# Patient Record
Sex: Male | Born: 1945 | Race: White | Hispanic: No | Smoking: Current every day smoker
Health system: Southern US, Community
[De-identification: ages and names within clinical notes are randomized; demographics above are authoritative.]

## PROBLEM LIST (undated history)

## (undated) DIAGNOSIS — I639 Cerebral infarction, unspecified: Secondary | ICD-10-CM

## (undated) DIAGNOSIS — E119 Type 2 diabetes mellitus without complications: Secondary | ICD-10-CM

## (undated) DIAGNOSIS — E785 Hyperlipidemia, unspecified: Secondary | ICD-10-CM

## (undated) DIAGNOSIS — I480 Paroxysmal atrial fibrillation: Secondary | ICD-10-CM

## (undated) DIAGNOSIS — I10 Essential (primary) hypertension: Secondary | ICD-10-CM

## (undated) HISTORY — PX: BACK SURGERY: SHX140

---

## 2020-03-10 ENCOUNTER — Encounter (HOSPITAL_COMMUNITY): Payer: Self-pay | Admitting: Internal Medicine

## 2020-03-10 ENCOUNTER — Other Ambulatory Visit: Payer: Self-pay

## 2020-03-10 ENCOUNTER — Inpatient Hospital Stay (HOSPITAL_COMMUNITY): Payer: 59

## 2020-03-10 ENCOUNTER — Encounter (HOSPITAL_COMMUNITY)
Admission: EM | Disposition: A | Discharge status: Skilled Nursing Facility | Payer: Self-pay | Source: Other Acute Inpatient Hospital | Attending: Internal Medicine

## 2020-03-10 ENCOUNTER — Inpatient Hospital Stay (HOSPITAL_COMMUNITY)
Admission: EM | Admit: 2020-03-10 | Discharge: 2020-03-15 | DRG: 378 | Disposition: A | Discharge status: Skilled Nursing Facility | Payer: 59 | Source: Other Acute Inpatient Hospital | Attending: Internal Medicine | Admitting: Internal Medicine

## 2020-03-10 DIAGNOSIS — K5790 Diverticulosis of intestine, part unspecified, without perforation or abscess without bleeding: Secondary | ICD-10-CM | POA: Diagnosis present

## 2020-03-10 DIAGNOSIS — E119 Type 2 diabetes mellitus without complications: Secondary | ICD-10-CM | POA: Diagnosis present

## 2020-03-10 DIAGNOSIS — R05 Cough: Secondary | ICD-10-CM | POA: Diagnosis present

## 2020-03-10 DIAGNOSIS — R12 Heartburn: Secondary | ICD-10-CM | POA: Diagnosis present

## 2020-03-10 DIAGNOSIS — R296 Repeated falls: Secondary | ICD-10-CM | POA: Diagnosis present

## 2020-03-10 DIAGNOSIS — I119 Hypertensive heart disease without heart failure: Secondary | ICD-10-CM | POA: Diagnosis present

## 2020-03-10 DIAGNOSIS — M1990 Unspecified osteoarthritis, unspecified site: Secondary | ICD-10-CM | POA: Diagnosis present

## 2020-03-10 DIAGNOSIS — Z20822 Contact with and (suspected) exposure to covid-19: Secondary | ICD-10-CM | POA: Diagnosis present

## 2020-03-10 DIAGNOSIS — R630 Anorexia: Secondary | ICD-10-CM | POA: Diagnosis present

## 2020-03-10 DIAGNOSIS — Z7901 Long term (current) use of anticoagulants: Secondary | ICD-10-CM

## 2020-03-10 DIAGNOSIS — W19XXXA Unspecified fall, initial encounter: Secondary | ICD-10-CM | POA: Diagnosis present

## 2020-03-10 DIAGNOSIS — Z8249 Family history of ischemic heart disease and other diseases of the circulatory system: Secondary | ICD-10-CM

## 2020-03-10 DIAGNOSIS — I69328 Other speech and language deficits following cerebral infarction: Secondary | ICD-10-CM

## 2020-03-10 DIAGNOSIS — F17210 Nicotine dependence, cigarettes, uncomplicated: Secondary | ICD-10-CM | POA: Diagnosis present

## 2020-03-10 DIAGNOSIS — M170 Bilateral primary osteoarthritis of knee: Secondary | ICD-10-CM | POA: Diagnosis present

## 2020-03-10 DIAGNOSIS — E785 Hyperlipidemia, unspecified: Secondary | ICD-10-CM | POA: Diagnosis present

## 2020-03-10 DIAGNOSIS — K2971 Gastritis, unspecified, with bleeding: Secondary | ICD-10-CM | POA: Diagnosis present

## 2020-03-10 DIAGNOSIS — R531 Weakness: Secondary | ICD-10-CM | POA: Diagnosis present

## 2020-03-10 DIAGNOSIS — J449 Chronic obstructive pulmonary disease, unspecified: Secondary | ICD-10-CM | POA: Diagnosis present

## 2020-03-10 DIAGNOSIS — R0602 Shortness of breath: Secondary | ICD-10-CM | POA: Diagnosis not present

## 2020-03-10 DIAGNOSIS — I10 Essential (primary) hypertension: Secondary | ICD-10-CM | POA: Diagnosis not present

## 2020-03-10 DIAGNOSIS — M25561 Pain in right knee: Secondary | ICD-10-CM

## 2020-03-10 DIAGNOSIS — R778 Other specified abnormalities of plasma proteins: Secondary | ICD-10-CM | POA: Diagnosis present

## 2020-03-10 DIAGNOSIS — K922 Gastrointestinal hemorrhage, unspecified: Secondary | ICD-10-CM | POA: Diagnosis present

## 2020-03-10 DIAGNOSIS — R079 Chest pain, unspecified: Secondary | ICD-10-CM

## 2020-03-10 DIAGNOSIS — Z7984 Long term (current) use of oral hypoglycemic drugs: Secondary | ICD-10-CM

## 2020-03-10 DIAGNOSIS — K921 Melena: Secondary | ICD-10-CM | POA: Diagnosis present

## 2020-03-10 DIAGNOSIS — R0902 Hypoxemia: Secondary | ICD-10-CM | POA: Diagnosis present

## 2020-03-10 DIAGNOSIS — R4781 Slurred speech: Secondary | ICD-10-CM | POA: Diagnosis present

## 2020-03-10 DIAGNOSIS — M25562 Pain in left knee: Secondary | ICD-10-CM | POA: Diagnosis present

## 2020-03-10 DIAGNOSIS — Z79899 Other long term (current) drug therapy: Secondary | ICD-10-CM

## 2020-03-10 DIAGNOSIS — R000 Tachycardia, unspecified: Secondary | ICD-10-CM | POA: Diagnosis present

## 2020-03-10 DIAGNOSIS — Z720 Tobacco use: Secondary | ICD-10-CM | POA: Diagnosis present

## 2020-03-10 DIAGNOSIS — R6510 Systemic inflammatory response syndrome (SIRS) of non-infectious origin without acute organ dysfunction: Secondary | ICD-10-CM | POA: Diagnosis present

## 2020-03-10 DIAGNOSIS — D62 Acute posthemorrhagic anemia: Secondary | ICD-10-CM | POA: Diagnosis present

## 2020-03-10 DIAGNOSIS — K31811 Angiodysplasia of stomach and duodenum with bleeding: Secondary | ICD-10-CM | POA: Diagnosis present

## 2020-03-10 DIAGNOSIS — I69351 Hemiplegia and hemiparesis following cerebral infarction affecting right dominant side: Secondary | ICD-10-CM | POA: Diagnosis not present

## 2020-03-10 DIAGNOSIS — I480 Paroxysmal atrial fibrillation: Secondary | ICD-10-CM | POA: Diagnosis present

## 2020-03-10 DIAGNOSIS — D175 Benign lipomatous neoplasm of intra-abdominal organs: Secondary | ICD-10-CM | POA: Diagnosis present

## 2020-03-10 DIAGNOSIS — Z8673 Personal history of transient ischemic attack (TIA), and cerebral infarction without residual deficits: Secondary | ICD-10-CM

## 2020-03-10 DIAGNOSIS — R509 Fever, unspecified: Secondary | ICD-10-CM | POA: Diagnosis present

## 2020-03-10 HISTORY — DX: Hyperlipidemia, unspecified: E78.5

## 2020-03-10 HISTORY — DX: Essential (primary) hypertension: I10

## 2020-03-10 HISTORY — PX: GIVENS CAPSULE STUDY: SHX5432

## 2020-03-10 HISTORY — DX: Type 2 diabetes mellitus without complications: E11.9

## 2020-03-10 HISTORY — DX: Cerebral infarction, unspecified: I63.9

## 2020-03-10 HISTORY — DX: Paroxysmal atrial fibrillation: I48.0

## 2020-03-10 LAB — GLUCOSE, CAPILLARY
Glucose-Capillary: 130 mg/dL — ABNORMAL HIGH (ref 70–99)
Glucose-Capillary: 114 mg/dL — ABNORMAL HIGH (ref 70–99)
Glucose-Capillary: 124 mg/dL — ABNORMAL HIGH (ref 70–99)

## 2020-03-10 LAB — CBC WITH DIFFERENTIAL/PLATELET
Abs Immature Granulocytes: 0.04 10*3/uL (ref 0.00–0.07)
Basophils Absolute: 0 10*3/uL (ref 0.0–0.1)
Basophils Relative: 0 %
Eosinophils Absolute: 0.2 10*3/uL (ref 0.0–0.5)
Eosinophils Relative: 3 %
HCT: 31 % — ABNORMAL LOW (ref 39.0–52.0)
Hemoglobin: 10.2 g/dL — ABNORMAL LOW (ref 13.0–17.0)
Immature Granulocytes: 1 %
Lymphocytes Relative: 16 %
Lymphs Abs: 1.2 10*3/uL (ref 0.7–4.0)
MCH: 29.4 pg (ref 26.0–34.0)
MCHC: 32.9 g/dL (ref 30.0–36.0)
MCV: 89.3 fL (ref 80.0–100.0)
Monocytes Absolute: 0.9 10*3/uL (ref 0.1–1.0)
Monocytes Relative: 11 %
Neutro Abs: 5.3 10*3/uL (ref 1.7–7.7)
Neutrophils Relative %: 69 %
Platelets: 211 10*3/uL (ref 150–400)
RBC: 3.47 MIL/uL — ABNORMAL LOW (ref 4.22–5.81)
RDW: 16.4 % — ABNORMAL HIGH (ref 11.5–15.5)
WBC: 7.7 10*3/uL (ref 4.0–10.5)
nRBC: 0 % (ref 0.0–0.2)

## 2020-03-10 LAB — ECHOCARDIOGRAM COMPLETE
Height: 71 in
Weight: 3200 oz

## 2020-03-10 LAB — MRSA PCR SCREENING: MRSA by PCR: NEGATIVE

## 2020-03-10 LAB — COMPREHENSIVE METABOLIC PANEL
ALT: 21 U/L (ref 0–44)
AST: 46 U/L — ABNORMAL HIGH (ref 15–41)
Albumin: 2.7 g/dL — ABNORMAL LOW (ref 3.5–5.0)
Alkaline Phosphatase: 55 U/L (ref 38–126)
Anion gap: 8 (ref 5–15)
BUN: 10 mg/dL (ref 8–23)
CO2: 21 mmol/L — ABNORMAL LOW (ref 22–32)
Calcium: 7.9 mg/dL — ABNORMAL LOW (ref 8.9–10.3)
Chloride: 111 mmol/L (ref 98–111)
Creatinine, Ser: 1.02 mg/dL (ref 0.61–1.24)
GFR calc Af Amer: >60 mL/min (ref >60)
GFR calc non Af Amer: >60 mL/min (ref >60)
Glucose, Bld: 142 mg/dL — ABNORMAL HIGH (ref 70–99)
Potassium: 3.6 mmol/L (ref 3.5–5.1)
Sodium: 140 mmol/L (ref 135–145)
Total Bilirubin: 1.5 mg/dL — ABNORMAL HIGH (ref 0.3–1.2)
Total Protein: 5.4 g/dL — ABNORMAL LOW (ref 6.5–8.1)

## 2020-03-10 LAB — MAGNESIUM: Magnesium: 1.5 mg/dL — ABNORMAL LOW (ref 1.7–2.4)

## 2020-03-10 LAB — HEMOGLOBIN AND HEMATOCRIT, BLOOD
HCT: 28.8 % — ABNORMAL LOW (ref 39.0–52.0)
Hemoglobin: 9.4 g/dL — ABNORMAL LOW (ref 13.0–17.0)

## 2020-03-10 LAB — ABO/RH: ABO/RH(D): B POS

## 2020-03-10 LAB — TYPE AND SCREEN
ABO/RH(D): B POS
Antibody Screen: NEGATIVE

## 2020-03-10 LAB — TROPONIN I (HIGH SENSITIVITY)
Troponin I (High Sensitivity): 4585 ng/L (ref <18)
Troponin I (High Sensitivity): 4405 ng/L (ref <18)

## 2020-03-10 SURGERY — IMAGING PROCEDURE, GI TRACT, INTRALUMINAL, VIA CAPSULE
Anesthesia: LOCAL

## 2020-03-10 MED ORDER — PANTOPRAZOLE SODIUM 40 MG IV SOLR: 40.0000 mg | Freq: Two times a day (BID) | INTRAVENOUS | Status: DC

## 2020-03-10 MED ORDER — SODIUM CHLORIDE 0.9 % IV SOLN
8.0000 mg/h | INTRAVENOUS | Status: DC
  Administered 2020-03-10 – 2020-03-11 (×4): 8 mg/h via INTRAVENOUS
  Filled 2020-03-10 (×5): qty 80

## 2020-03-10 MED ORDER — SODIUM CHLORIDE 0.9% FLUSH
3.0000 mL | Freq: Two times a day (BID) | INTRAVENOUS | Status: DC
  Administered 2020-03-10 – 2020-03-15 (×10): 3 mL via INTRAVENOUS

## 2020-03-10 MED ORDER — INSULIN ASPART 100 UNIT/ML ~~LOC~~ SOLN
0.0000 [IU] | Freq: Three times a day (TID) | SUBCUTANEOUS | Status: DC
  Administered 2020-03-11 – 2020-03-15 (×7): 1 [IU] via SUBCUTANEOUS

## 2020-03-10 MED ORDER — ACETAMINOPHEN 325 MG PO TABS
650.0000 mg | ORAL_TABLET | Freq: Four times a day (QID) | ORAL | Status: DC | PRN
  Administered 2020-03-11 – 2020-03-15 (×2): 650 mg via ORAL
  Filled 2020-03-10 (×2): qty 2

## 2020-03-10 MED ORDER — AMIODARONE HCL IN DEXTROSE 360-4.14 MG/200ML-% IV SOLN
60.0000 mg/h | INTRAVENOUS | Status: AC
  Administered 2020-03-10 (×2): 60 mg/h via INTRAVENOUS
  Filled 2020-03-10: qty 200

## 2020-03-10 MED ORDER — CHLORHEXIDINE GLUCONATE CLOTH 2 % EX PADS
6.0000 | MEDICATED_PAD | Freq: Every day | CUTANEOUS | Status: DC
  Administered 2020-03-11 – 2020-03-14 (×4): 6 via TOPICAL

## 2020-03-10 MED ORDER — INSULIN ASPART 100 UNIT/ML ~~LOC~~ SOLN
0.0000 [IU] | Freq: Every day | SUBCUTANEOUS | Status: DC
  Administered 2020-03-12: 2 [IU] via SUBCUTANEOUS

## 2020-03-10 MED ORDER — ALBUTEROL SULFATE (2.5 MG/3ML) 0.083% IN NEBU: 2.5000 mg | INHALATION_SOLUTION | Freq: Four times a day (QID) | RESPIRATORY_TRACT | Status: DC | PRN

## 2020-03-10 MED ORDER — SODIUM CHLORIDE 0.9 % IV SOLN
2.0000 g | INTRAVENOUS | Status: DC
  Administered 2020-03-10: 2 g via INTRAVENOUS
  Filled 2020-03-10: qty 20

## 2020-03-10 MED ORDER — SODIUM CHLORIDE 0.9 % IV SOLN
80.0000 mg | Freq: Once | INTRAVENOUS | Status: AC
  Administered 2020-03-10: 80 mg via INTRAVENOUS
  Filled 2020-03-10: qty 80

## 2020-03-10 MED ORDER — AMIODARONE LOAD VIA INFUSION
150.0000 mg | Freq: Once | INTRAVENOUS | Status: AC
  Administered 2020-03-10: 150 mg via INTRAVENOUS
  Filled 2020-03-10: qty 83.34

## 2020-03-10 MED ORDER — DILTIAZEM HCL-DEXTROSE 125-5 MG/125ML-% IV SOLN (PREMIX)
5.0000 mg/h | INTRAVENOUS | Status: DC
  Filled 2020-03-10: qty 125

## 2020-03-10 MED ORDER — METRONIDAZOLE IN NACL 5-0.79 MG/ML-% IV SOLN
500.0000 mg | Freq: Three times a day (TID) | INTRAVENOUS | Status: DC
  Administered 2020-03-10 – 2020-03-11 (×3): 500 mg via INTRAVENOUS
  Filled 2020-03-10 (×3): qty 100

## 2020-03-10 MED ORDER — AMIODARONE HCL IN DEXTROSE 360-4.14 MG/200ML-% IV SOLN
30.0000 mg/h | INTRAVENOUS | Status: DC
  Administered 2020-03-10 – 2020-03-11 (×2): 30 mg/h via INTRAVENOUS
  Filled 2020-03-10 (×3): qty 200

## 2020-03-10 MED ORDER — MAGNESIUM SULFATE 2 GM/50ML IV SOLN
2.0000 g | Freq: Once | INTRAVENOUS | Status: AC
  Administered 2020-03-10: 2 g via INTRAVENOUS
  Filled 2020-03-10: qty 50

## 2020-03-10 MED ORDER — MORPHINE SULFATE (PF) 2 MG/ML IV SOLN
2.0000 mg | INTRAVENOUS | Status: DC | PRN
  Administered 2020-03-10 – 2020-03-13 (×9): 2 mg via INTRAVENOUS
  Filled 2020-03-10 (×9): qty 1

## 2020-03-10 MED ORDER — PERFLUTREN LIPID MICROSPHERE
1.0000 mL | INTRAVENOUS | Status: AC | PRN
  Administered 2020-03-10: 3 mL via INTRAVENOUS
  Filled 2020-03-10: qty 10

## 2020-03-10 MED ORDER — ACETAMINOPHEN 650 MG RE SUPP: 650.0000 mg | Freq: Four times a day (QID) | RECTAL | Status: DC | PRN

## 2020-03-10 SURGICAL SUPPLY — 1 items: TOWEL COTTON PACK 4EA (MISCELLANEOUS) ×4 IMPLANT

## 2020-03-10 NOTE — Consult Note (Addendum)
Referring Provider: [facility] Primary Care Physician:  Patient, No Pcp Per Primary Gastroenterologist:  Unassigned  Reason for Consultation:  Melena, anemia  HPI: Corey Esparza is a 74 y.o. male with history of A fib (on amiodarone, denies recent Eliquis use but was previously on Eliquis), NSTEMI, recent CVA and COPD, presenting from an outside hospital for melena and anemia.  He underwent EGD and colonoscopy which were both unrevealing for any sources of bleeding.  Patient initially presented to outside ED with chest pain and shortness of breath and was found to have a Hgb of 8.3 with heme-positive stools.  EGD by general surgery revealed gastritis with some blood in the stomach but no active bleeding.  Colonoscopy was pertinent for diverticulosis and a lipoma but no signs of bleeding.  Patient states that he has had melena for approximately 3 months.  He typically has one soft, melenic stool daily to every other day.  He denies any hematochezia.  He denies any abdominal pain, chest pain, shortness of breath, nausea, vomiting, hematemesis, dysphagia, changes in appetite, unexplained weight loss.  He does state that he has had heartburn but it has improved since he has been in the hospital and being treated with PPI therapy.  Patient states he takes BC powder at least twice per week.  Denies blood thinner use.  Drinks approximately 6 beers per day.  Denies prior episodes of GI bleeding.  Denies any family history of colon cancer or gastrointestinal malignancies.  No past medical history on file.   Prior to Admission medications   Not on File    Scheduled Meds: . [START ON 03/13/2020] pantoprazole  40 mg Intravenous Q12H  . sodium chloride flush  3 mL Intravenous Q12H   Continuous Infusions: . amiodarone 60 mg/hr (03/10/20 0728)   Followed by  . amiodarone    . pantoprozole (PROTONIX) infusion     PRN Meds:.acetaminophen **OR** acetaminophen, albuterol  Allergies as of  03/09/2020  . (Not on File)    No family history on file.  Social History   Socioeconomic History  . Marital status: Single    Spouse name: Not on file  . Number of children: Not on file  . Years of education: Not on file  . Highest education level: Not on file  Occupational History  . Not on file  Tobacco Use  . Smoking status: Not on file  Substance and Sexual Activity  . Alcohol use: Not on file  . Drug use: Not on file  . Sexual activity: Not on file  Other Topics Concern  . Not on file  Social History Narrative  . Not on file   Social Determinants of Health   Financial Resource Strain:   . Difficulty of Paying Living Expenses:   Food Insecurity:   . Worried About Charity fundraiser in the Last Year:   . Arboriculturist in the Last Year:   Transportation Needs:   . Film/video editor (Medical):   Marland Kitchen Lack of Transportation (Non-Medical):   Physical Activity:   . Days of Exercise per Week:   . Minutes of Exercise per Session:   Stress:   . Feeling of Stress :   Social Connections:   . Frequency of Communication with Friends and Family:   . Frequency of Social Gatherings with Friends and Family:   . Attends Religious Services:   . Active Member of Clubs or Organizations:   . Attends Archivist Meetings:   .  Marital Status:   Intimate Partner Violence:   . Fear of Current or Ex-Partner:   . Emotionally Abused:   Marland Kitchen Physically Abused:   . Sexually Abused:     Review of Systems: Review of Systems  Constitutional: Negative for chills, fever and weight loss.  HENT: Negative for hearing loss and tinnitus.   Eyes: Negative for pain and redness.  Respiratory: Negative for cough and shortness of breath.   Cardiovascular: Negative for chest pain and palpitations.  Gastrointestinal: Positive for heartburn and melena. Negative for abdominal pain, blood in stool, constipation, diarrhea, nausea and vomiting.  Genitourinary: Negative for flank pain and  hematuria.  Musculoskeletal: Negative for back pain and joint pain.  Skin: Negative for itching and rash.  Neurological: Negative for seizures and loss of consciousness.  Endo/Heme/Allergies: Negative for polydipsia. Does not bruise/bleed easily.  Psychiatric/Behavioral: Positive for substance abuse (ETOH). The patient is not nervous/anxious.      Physical Exam: Vital signs: Vitals:   03/10/20 0600 03/10/20 0748  BP: 113/75   Resp: (!) 23   Temp:  (!) 97.5 F (36.4 C)     Physical Exam Constitutional:      General: He is not in acute distress.    Appearance: Normal appearance.  HENT:     Head: Normocephalic and atraumatic.     Mouth/Throat:     Mouth: Mucous membranes are dry.     Pharynx: Oropharynx is clear.  Eyes:     General: No scleral icterus.    Extraocular Movements: Extraocular movements intact.     Comments: Conjunctival pallor  Cardiovascular:     Rate and Rhythm: Normal rate and regular rhythm.     Pulses: Normal pulses.     Heart sounds: Normal heart sounds.  Pulmonary:     Effort: Pulmonary effort is normal. No respiratory distress.     Breath sounds: Wheezing (expiratory, bilateral) present.  Abdominal:     General: Bowel sounds are normal. There is no distension.     Palpations: Abdomen is soft. There is no mass.     Tenderness: There is no abdominal tenderness. There is no guarding.  Musculoskeletal:        General: No swelling or deformity.     Cervical back: Normal range of motion and neck supple.     Right lower leg: No edema.     Left lower leg: No edema.  Skin:    General: Skin is warm and dry.  Neurological:     General: No focal deficit present.     Mental Status: He is alert and oriented to person, place, and time.  Psychiatric:        Mood and Affect: Mood normal.        Behavior: Behavior normal.      GI:  Lab Results: Recent Labs    03/10/20 0619  WBC 7.7  HGB 10.2*  HCT 31.0*  PLT 211   BMET Recent Labs     03/10/20 0619  NA 140  K 3.6  CL 111  CO2 21*  GLUCOSE 142*  BUN 10  CREATININE 1.02  CALCIUM 7.9*   LFT Recent Labs    03/10/20 0619  PROT 5.4*  ALBUMIN 2.7*  AST 46*  ALT 21  ALKPHOS 55  BILITOT 1.5*   PT/INR No results for input(s): LABPROT, INR in the last 72 hours.   Studies/Results: No results found.  Impression: Melena and anemia of unknown origin.  Negative EGD and colonoscopy at outside  hospital. -Hgb 10.2, no baseline on file for comparison -Renal function within normal limits (BUN 10/Cr 1.02) -Regular use of BC powder -Prior Eliquis use but no recent Eliquis use per patient -s/p ?2u pRBCs at outside hospital  NSTEMI: at time of presentation to outside facility -Troponin increased from 0.58 to 4.98  Febrile to 100.8 at outside hospital with negative chest x-ray. Currently afebrile  A fib: currently on Amiodarone  COPD  Plan: We will proceed with capsule endoscopy today.  I thoroughly discussed nature, benefits, and risks (including but not limited to capsule retention) with the patient.  Patient verbalized understanding and is in agreement to proceed with capsule endoscopy today.  Continue Protonix IV twice daily.  Continue to monitor H&H with transfusion as needed to maintain hemoglobin greater than 7.  [facility] will follow.   LOS: 0 days   Salley Slaughter  PA-C 03/10/2020, 8:50 AM  Contact #  [phone]

## 2020-03-10 NOTE — H&P (Addendum)
History and Physical    Corey Esparza SWF:[id number] DOB: 16-Jan-1946 DOA: 03/10/2020  Referring MD/NP/PA: Jennette Kettle, MD PCP: Patient, No Pcp Per  Patient coming from: Transfer from [place]  Chief Complaint: Weakness  I have personally briefly reviewed patient's old medical records in [place]   HPI: Corey Esparza is a 74 y.o. male with medical history significant of paroxysmal atrial fibrillation on Eliquis, CVA, hypertension, hyperlipidemia, and diabetes mellitus type 2 presented to person [facility] on 6/7 with complaints of weakness and inability to care for himself at home.  History is obtained from review of records and in talks with the patient.  It was reported that stool was found everywhere in the patient's home. Patient had just recently been hospitalized 5/10-5/14, where MRI revealed an acute infarction of the posterior limb of the left internal capsule along with multiple chronic CVAs. It appears he was sent home with Eliquis and metoprolol, but patient denies that he was taking the medications. He complained of some generalized abdominal pain. The initial CT scan of the brain showed no acute abnormalities. Labs revealed a hemoglobin of 8.3 with Hemoccult positive stools.  General surgery performed an EGD on 6/8, which revealed gastritis with some blood in the stomach and duodenum without signs of active bleeding.  On 6/9 patient noted to have melanotic stools with plan for bowel prep overnight.  On 6/10 patient complained of chest pain and shortness of breath found to be in atrial fibrillation with RVR troponin ->3.18->6.65. He was also found to be febrile up to 100.8 F with lactic acid is elevated up to 2.3.  Chest x-ray have been noted to be clear. Patient was started on Rocephin and metronidazole empirically for SIRS of unknown source, but suspected GI in nature.  CT scan of the abdomen and pelvis noted diverticulosis without signs diverticulitis.   Patient's hemoglobin dropped as low as 7.9 during his hospital stay, and he received a total 2 units of blood. General surgery also performed a colonoscopy which showed colon lipoma and diverticulosis with no active signs of bleeding. Patient notes that he takes a BC powder every now and then. He complains of right leg pain at this time and last fell about a week ago.  Denies active chest pain at this time.  Records note that patient's daughter had came to the hospital and brought POA papers and a letter from the patient's doctor at [facility] stating that patient is not able to make his own decisions and that he is in need of long-term placement.   Transfer had ultimately been requested due to rising troponins and need of possible cardiac intervention.  ED Course: As seen above  Review of Systems  Constitutional: Negative for fever and malaise/fatigue.  HENT: Negative for ear discharge.   Eyes: Negative for photophobia and pain.  Respiratory: Positive for shortness of breath.   Cardiovascular: Positive for chest pain. Negative for leg swelling.  Gastrointestinal: Positive for abdominal pain. Negative for blood in stool.  Genitourinary: Positive for hematuria (Intermittently). Negative for dysuria.  Musculoskeletal: Positive for falls and joint pain.  Skin: Negative for itching.  Neurological: Positive for speech change (Secondary to previous stroke). Negative for loss of consciousness.  Endo/Heme/Allergies: Negative for polydipsia.  Psychiatric/Behavioral: Positive for substance abuse.    Past Medical History:  Diagnosis Date  . CVA (cerebral vascular accident) ([place])   . Diabetes mellitus, type 2 ([place])   . HTN (hypertension)   . Hyperlipidemia   .  Paroxysmal atrial fibrillation (HCC)      reports that he has been smoking cigarettes. He has a 3.75 pack-year smoking history. He has never used smokeless tobacco. He reports previous alcohol use. He reports previous drug use.  No  Known Allergies  History reviewed. No pertinent family history.  Prior to Admission medications   -Aspirin 81 mg daily -Metformin 1.5 tab p.o. twice daily with meals -Furosemide 20 mg daily -Magnesium oxide 400 mg daily -Atorvastatin 40 mg nightly -Lisinopril 10 mg daily -Eliquis 10 mg twice daily, -Metoprolol 25 mg p.o. twice daily  Physical Exam:  Constitutional: Elderly male in NAD, calm, comfortable Vitals:   03/10/20 0600 03/10/20 0748 03/10/20 0903 03/10/20 1000  BP: 113/75  118/69 117/64  Pulse:   83 82  Resp: (!) 23  18 19   Temp:  (!) 97.5 F (36.4 C)    TempSrc:  Oral    SpO2:   95% 99%  Weight:      Height:       Eyes: PERRL, lids and conjunctivae normal ENMT: Mucous membranes are moist. Posterior pharynx clear of any exudate or lesions.  Neck: normal, supple, no masses, no thyromegaly Respiratory: clear to auscultation bilaterally, no wheezing, no crackles. Normal respiratory effort. No accessory muscle use.  Cardiovascular: Regular rate and rhythm, no murmurs / rubs / gallops. No extremity edema. 2+ pedal pulses. No carotid bruits.  Abdomen: Mild tenderness to palpation, no masses palpated. No hepatosplenomegaly. Bowel sounds positive.  Musculoskeletal: no clubbing / cyanosis. Right knee pain with movement. Skin: no rashes, lesions, ulcers. No induration Neurologic: Patient with some slurred speech and some right-sided weakness appreciated. Psychiatric: Normal judgment and insight. Alert and oriented x 3. Normal mood.     Labs on Admission: I have personally reviewed following labs and imaging studies  CBC: Recent Labs  Lab 03/10/20 0619  WBC 7.7  NEUTROABS 5.3  HGB 10.2*  HCT 31.0*  MCV 89.3  PLT 026   Basic Metabolic Panel: Recent Labs  Lab 03/10/20 0619 03/10/20 0844  NA 140  --   K 3.6  --   CL 111  --   CO2 21*  --   GLUCOSE 142*  --   BUN 10  --   CREATININE 1.02  --   CALCIUM 7.9*  --   MG  --  1.5*   GFR: Estimated Creatinine  Clearance: 67.7 mL/min (by C-G formula based on SCr of 1.02 mg/dL). Liver Function Tests: Recent Labs  Lab 03/10/20 0619  AST 46*  ALT 21  ALKPHOS 55  BILITOT 1.5*  PROT 5.4*  ALBUMIN 2.7*   No results for input(s): LIPASE, AMYLASE in the last 168 hours. No results for input(s): AMMONIA in the last 168 hours. Coagulation Profile: No results for input(s): INR, PROTIME in the last 168 hours. Cardiac Enzymes: No results for input(s): CKTOTAL, CKMB, CKMBINDEX, TROPONINI in the last 168 hours. BNP (last 3 results) No results for input(s): PROBNP in the last 8760 hours. HbA1C: No results for input(s): HGBA1C in the last 72 hours. CBG: Recent Labs  Lab 03/10/20 0526  GLUCAP 130*   Lipid Profile: No results for input(s): CHOL, HDL, LDLCALC, TRIG, CHOLHDL, LDLDIRECT in the last 72 hours. Thyroid Function Tests: No results for input(s): TSH, T4TOTAL, FREET4, T3FREE, THYROIDAB in the last 72 hours. Anemia Panel: No results for input(s): VITAMINB12, FOLATE, FERRITIN, TIBC, IRON, RETICCTPCT in the last 72 hours. Urine analysis: No results found for: COLORURINE, APPEARANCEUR, [place], [place], [place], [place], [place],  KETONESUR, PROTEINUR, UROBILINOGEN, NITRITE, LEUKOCYTESUR Sepsis Labs: Recent Results (from the past 240 hour(s))  MRSA PCR Screening     Status: None   Collection Time: 03/10/20  5:41 AM   Specimen: Nasal Mucosa; Nasopharyngeal  Result Value Ref Range Status   MRSA by PCR NEGATIVE NEGATIVE Final    Comment:        The GeneXpert MRSA Assay (FDA approved for NASAL specimens only), is one component of a comprehensive MRSA colonization surveillance program. It is not intended to diagnose MRSA infection nor to guide or monitor treatment for MRSA infections. Performed at [facility], [place] [address] [place], [place] [address]      Radiological Exams on Admission: No results found.  EKG: Independently reviewed.  Atrial fibrillation with RVR and a rate  of 143 bpm  Assessment/Plan GI bleed, acute blood loss anemia: Patient presents as a transfer for reports of weakness found to have signs of GI bleed.  EGD and colonoscopy performed at outside facility.  The EGD found to have moderate inflammation in the mucosa of the first part of the duodenum and gastric antrum with denatured blood in the duodenal and stomach lumen.  Question possibility of gastroduodenitis secondary to Eliquis. Patient has been transfused 2 units of blood after hemoglobin noted to be as low as 7.9. And hemoglobin on arrival here 10.2. -Admit to a progressive bed -N.p.o. -Monitor intake and output -Continue to monitor H&H [facility] gastroenterology consulted, will follow-up for further recommendation  SIRS: Patient was noted to have fever up to 100.8 F with tachycardia and lactic acid elevated up to 2.3.  Question GI cause patient had been started empirically on Rocephin and metronidazole facility. -Will continue Rocephin and metronidazole IV for now  NSTEMI: Acute.  Patient was noted to complain of chest pain at the outside facility with initial EKG showing lateral ST depressions.  Troponin elevated up to 6.65 at the outside facility, but elevated up to 4585 with our high-sensitivity troponin. Secondary to demand in the setting of GI bleed and A. fib with RVR. -Continue to trend cardiac troponin -Check echocardiogram -Cardiology formally consulted, will follow-up for any further recommendation  Paroxysmal atrial fibrillation: Acute on chronic.  Patient presented in A. fib with RVR. Home medications previously included metoprolol for rate control, but unclear if he was taking this at home. He is known history of atrial fibrillation and is supposed to be on Eliquis, but reported not taking this as well.  -Continue amiodarone drip -Hold Eliquis -Restart metoprolol when able to take p.o. -Goal potassium 4 and magnesium 2  Hypomagnesemia: Acute. Patient presents with magnesium  1.5. Had been on daily magnesium supplementation. -Give 2 g of magnesium sulfate IV -Continue previous home supplementation when able  Right knee pain: Patient reports last following 2 weeks ago. Complaining of pain in the right knee especially with movement. -X-rays of right knee -Morphine IV as needed until capsule endoscopy completed  Recent CVA: Patient reported to have a left posterior internal capsule stroke back in on May 10.  He appears to have some slurred speech and weakness on the right side likely secondary to the previous stroke. -Continue statin  Diabetes mellitus type 2: Home medications include Metformin. Initial blood sugars noted to be relatively stable at 142. -Hypoglycemic protocol -Hold Metformin -CBGs before every meal and at bedtime with sensitive SSI  Essential hypertension: Home blood pressure medications include metoprolol 5 mg twice daily and lisinopril 10 mg daily. -Restart home regimen when able  Elevated liver enzymes, hyperbilirubinemia: Acute. Total bilirubin 1.5 and AST 46. -Continue to monitor  Hyperlipidemia: -Continue atorvastatin  Tobacco abuse: Patient reports smoking 1 pack cigarettes per week on average. He declines need of nicotine patch. -Continue to counsel on need of cessation of tobacco use.  DVT prophylaxis: scd    Code Status: Full Family Communication: Attempted to contact daughter over the phone Disposition Plan: Possible discharge home in 2 to 3 days Consults called: GI  Admission status: Inpatient   Norval Morton MD [facility] Pager [phone]   If 7PM-7AM, please contact night-coverage [email or web address] Password [email or web address]  03/10/2020, 10:20 AM

## 2020-03-10 NOTE — Progress Notes (Addendum)
Error

## 2020-03-10 NOTE — Progress Notes (Signed)
Patient swallowed givens capsule with no problems at 1111.  Instructions reviewed with patient.  Patient offered copy of instruction sheet and he requested it be given to his nurse.  RN given instruction sheet of when patient can eat.  Endo nurse will pick up equipment on 03/11/2020.

## 2020-03-10 NOTE — TOC Initial Note (Signed)
Transition of Care [facility]) - Initial/Assessment Note    Patient Details  Name: Corey Esparza MRN: [id number] Date of Birth: 12/29/1945  [facility] CM/SW Contact:    Bartholomew Crews, RN Phone Number: [phone] 03/10/2020, 12:58 PM  Clinical Narrative:                  Spoke with patient at the bedside. PTA home alone. States that he can't walk, but had previously been able to perform basic adls and do simple cooking. Discussed that therapy will be working with him, and may recommend some rehab. Patient is agreeable, but would prefer to be in the [place] area where he is from. Discussed that his daughter, Corey Esparza, wanted to talk with NCM about discharge planning - patient provided permission to discuss his care needs and transition plans with his daughter, Corey Esparza.   Spoke with Corey Esparza on the phone. Number in Epic is correct. Discussed that she has his permission for NCM to speak with her. She has provided HCPOA and durable POA which are located on patient chart, however, patient does not have a legal guardian. She verbalized understanding.   Corey Esparza stated that patient had a stroke 2 years ago October, and had few deficits. However, last month 02/07/20 he had a 2nd stroke, and now he is unable to walk or live alone. He recently discharged home from local hospital where he was supposed to have home health, however, home health agency was not able to see patient for a week. Patient was sent back to the hospital where he was found to have a GI bleed and transferred to this hospital.  Corey Esparza understands that patient will need rehab. She has researched inpatient rehab at [facility], and would like patient to go there for rehab. Discussed that PT and OT will evaluate patient and make recommendations. Advised that CIR has certain criteria that must be met for admission. Discussed back up plan for SNF if recommended and not a candidate for CIR. Corey Esparza is agreeable. Reviewed process for SNF. If  possible, she would like patient to be in [place] area.   TOC following for transition needs.   Expected Discharge Plan: Skilled Nursing Facility Barriers to Discharge: Continued Medical Work up   Patient Goals and CMS Choice Patient states their goals for this hospitalization and ongoing recovery are:: needs rehab CMS Medicare.gov Compare Post Acute Care list provided to:: Patient Choice offered to / list presented to : Patient, Adult Children  Expected Discharge Plan and Services Expected Discharge Plan: [place] In-house Referral: Clinical Social Work Discharge Planning Services: CM Consult   Living arrangements for the past 2 months: [place]                                      Prior Living Arrangements/Services Living arrangements for the past 2 months: Single Family Home Lives with:: Self Patient language and need for interpreter reviewed:: Yes        Need for Family Participation in Patient Care: Yes (Comment) Care giver support system in place?: No (comment) Current home services: DME Criminal Activity/Legal Involvement Pertinent to Current Situation/Hospitalization: No - Comment as needed  Activities of Daily Living Home Assistive Devices/Equipment: None ADL Screening (condition at time of admission) Patient's cognitive ability adequate to safely complete daily activities?: Yes Is the patient deaf or have difficulty hearing?: No Does the patient have difficulty  seeing, even when wearing glasses/contacts?: No Does the patient have difficulty concentrating, remembering, or making decisions?: No Patient able to express need for assistance with ADLs?: No Does the patient have difficulty dressing or bathing?: No Independently performs ADLs?: Yes (appropriate for developmental age) Does the patient have difficulty walking or climbing stairs?: No Weakness of Legs: Right Weakness of Arms/Hands: None  Permission  Sought/Granted Permission sought to share information with : Family Supports Permission granted to share information with : Yes, Verbal Permission Granted  Share Information with NAME: Corey Esparza     Permission granted to share info w Relationship: daughter  Permission granted to share info w Contact Information: [phone]  Emotional Assessment Appearance:: Appears stated age Attitude/Demeanor/Rapport: Engaged Affect (typically observed): Accepting Orientation: : Oriented to Self, Oriented to  Time, Oriented to Place, Oriented to Situation Alcohol / Substance Use: Not Applicable Psych Involvement: No (comment)  Admission diagnosis:  GI bleed [K92.2] Patient Active Problem List   Diagnosis Date Noted  . GI bleed 03/10/2020  . Essential hypertension 03/10/2020  . SIRS (systemic inflammatory response syndrome) ([place]) 03/10/2020  . Hypomagnesemia 03/10/2020  . History of CVA (cerebrovascular accident) 03/10/2020  . HLD (hyperlipidemia) 03/10/2020  . Tobacco abuse 03/10/2020  . AF (paroxysmal atrial fibrillation) ([place])    PCP:  Patient, No Pcp Per Pharmacy:  No Pharmacies Listed    Social Determinants of Health (SDOH) Interventions    Readmission Risk Interventions No flowsheet data found.

## 2020-03-10 NOTE — Consult Note (Signed)
Cardiology Consultation:   Patient ID: Corey Esparza; [id number]; 11/29/1945   Admit date: 03/10/2020 Date of Consult: 03/10/2020  Primary Care Provider: Patient, No Pcp Per [facility] Primary Cardiologist: Evalina Field, MD New Primary Electrophysiologist:  None   Patient Profile:   Corey Esparza is a 74 y.o. male with a hx of PAF (supposed to be on Eliquis but not taking), DM, HTN, HLD, CVA (hospitalized 05/10-05/14/2021), who was transferred to [facility] from [facility] in [place]. He is being seen today for the evaluation of elevated troponin & Afib at the request of Dr Tamala Julian.  History of Present Illness:   Corey Esparza went to [facility] 06/07 with weakness. Dx Anemia (s/p 2 U PRBCs for Hgb 7.9) w/ heme+ stools, gastritis on EGD. Colonoscopy planned, but pt went into rapid Afib, associated w/ CP & SOB. Trop elevated, +fever, elevated lactic acid, on ABX for SIRS w/ unknown source.   Pt stabilized and had colonoscopy w/ lipoma and diverticulosis but no active bleeding.   Dtr has POA papers and PCP sent letter saying pt needs placement.   Pt tx to [facility] 06/11 due to rising troponin and needing cardiac eval. Cards asked to see.   On arrival, Corey Esparza was in rapid atrial fibrillation.  He was given IV amiodarone bolus and load.  He spontaneously converted to sinus rhythm and the amiodarone was discontinued.  Corey Esparza is very aware when he is in atrial fibrillation.  He says that he gets palpitations, and will get lightheaded or dizzy.  He denies chest pain, but that is reported elsewhere in the notes.  He denies shortness of breath but that is also reported elsewhere in the notes.  He cannot say how often he has the episodes of atrial fibrillation or how long they last.  He is not feeling the atrial fibrillation now.  He also complains of pain in his right leg.  It is an aching in the muscles, not the joints.  It gets worse when he walks on it.  He takes [facility]  powders and Tylenol for this at home.  He says he has fallen multiple times recently.  He does not know how many.  He says he gets weak and falls.  He also says that his leg gets weak any falls.  Presyncope or syncope is not clear.  His activity level is poor at baseline, he is not able to walk very much or get around very well.  He says this is because of him getting weak and falling.  No obvious history of exertional chest pain.  He denies any history of bleeding, denies melena, hematemesis, or hematochezia.  He has no awareness of any blood loss.  He has never seen a cardiologist and has no history of any cardiac evaluation.   Past Medical History:  Diagnosis Date  . CVA (cerebral vascular accident) ([place])   . Diabetes mellitus, type 2 ([place])   . HTN (hypertension)   . Hyperlipidemia   . Paroxysmal atrial fibrillation [facility])     Past Surgical History:  Procedure Laterality Date  . BACK SURGERY       Prior to Admission medications   Medication Sig Start Date End Date Taking? Authorizing Provider  albuterol (VENTOLIN HFA) 108 (90 Base) MCG/ACT inhaler Inhale 1 puff into the lungs every 4 (four) hours as needed for shortness of breath. 03/05/20   [provider]  atorvastatin (LIPITOR) 40 MG tablet Take 40 mg by mouth at bedtime. 03/05/20  [provider]  ELIQUIS 5 MG TABS tablet Take 5 mg by mouth 2 (two) times daily. 03/05/20   [provider]  furosemide (LASIX) 20 MG tablet Take 20 mg by mouth daily. 03/05/20   [provider]  hydrochlorothiazide (HYDRODIURIL) 25 MG tablet Take 25 mg by mouth daily. 12/08/19   [provider]  lisinopril (ZESTRIL) 10 MG tablet Take 10 mg by mouth daily. 03/05/20   [provider]  metFORMIN (GLUCOPHAGE) 500 MG tablet Take 750 mg by mouth in the morning and at bedtime. 01/17/20   [provider]  metoprolol tartrate (LOPRESSOR) 25 MG tablet Take 25 mg by mouth 2 (two) times daily. 03/05/20    [provider]    Inpatient Medications: Scheduled Meds: . insulin aspart  0-5 Units Subcutaneous QHS  . insulin aspart  0-9 Units Subcutaneous TID WC  . [START ON 03/13/2020] pantoprazole  40 mg Intravenous Q12H  . sodium chloride flush  3 mL Intravenous Q12H   Continuous Infusions: . amiodarone 60 mg/hr (03/10/20 1005)   Followed by  . amiodarone    . cefTRIAXone (ROCEPHIN)  IV    . magnesium sulfate bolus IVPB    . metronidazole    . pantoprozole (PROTONIX) infusion 8 mg/hr (03/10/20 1000)   PRN Meds: acetaminophen **OR** acetaminophen, albuterol, morphine injection  Allergies:   No Known Allergies  Social History:   Social History   Socioeconomic History  . Marital status: Not on file    Spouse name: Not on file  . Number of children: 1  . Years of education: Not on file  . Highest education level: Not on file  Occupational History  . Occupation: Retired  Tobacco Use  . Smoking status: Current Every Day Smoker    Packs/day: 0.25    Years: 15.00    Pack years: 3.75    Types: Cigarettes  . Smokeless tobacco: Never Used  Vaping Use  . Vaping Use: Never used  Substance and Sexual Activity  . Alcohol use: Not Currently  . Drug use: Not Currently  . Sexual activity: Not Currently  Other Topics Concern  . Not on file  Social History Narrative   Daughter, who lives in [place], helps in his care   Social Determinants of Health   Financial Resource Strain:   . Difficulty of Paying Living Expenses:   Food Insecurity:   . Worried About Charity fundraiser in the Last Year:   . Arboriculturist in the Last Year:   Transportation Needs:   . Film/video editor (Medical):   Marland Kitchen Lack of Transportation (Non-Medical):   Physical Activity:   . Days of Exercise per Week:   . Minutes of Exercise per Session:   Stress:   . Feeling of Stress :   Social Connections:   . Frequency of Communication with Friends and Family:   . Frequency of Social Gatherings  with Friends and Family:   . Attends Religious Services:   . Active Member of Clubs or Organizations:   . Attends Archivist Meetings:   Marland Kitchen Marital Status:   Intimate Partner Violence:   . Fear of Current or Ex-Partner:   . Emotionally Abused:   Marland Kitchen Physically Abused:   . Sexually Abused:     Family History:   Family History  Problem Relation Age of Onset  . Heart attack Father 25  . Heart attack Brother        58s   Family  Status:  Family Status  Relation Name Status  . Mother  Deceased  . Father  Deceased  . Brother  Deceased    ROS:  Please see the history of present illness.  All other ROS reviewed and negative.     Physical Exam/Data:   Vitals:   03/10/20 0903 03/10/20 1000 03/10/20 1059 03/10/20 1125  BP: 118/69 117/64    Pulse: 83 82    Resp: 18 19    Temp:    97.6 F (36.4 C)  TempSrc:    Oral  SpO2: 95% 99%    Weight:   90.7 kg   Height:   5\' 11"  (1.803 m)     Intake/Output Summary (Last 24 hours) at 03/10/2020 1232 Last data filed at 03/10/2020 1000 Gross per 24 hour  Intake 29.94 ml  Output --  Net 29.94 ml    Last 3 Weights 03/10/2020 03/10/2020  Weight (lbs) 200 lb 198 lb 13.7 oz  Weight (kg) 90.719 kg 90.2 kg     Body mass index is 27.89 kg/m.   General:  Well nourished, well developed, male in no acute distress HEENT: normal Lymph: no adenopathy Neck: JVD -approximately 9 cm, difficult to assess Endocrine:  No thryomegaly Vascular: No carotid bruits; upper extremity pulses 2+, lower extremity pulses difficult to assess, bilateral femoral bruits Cardiac:  normal S1, S2; RRR; soft murmur Lungs: Decreased breath sounds bases bilaterally, no wheezing, rhonchi, few rales  Abd: soft, nontender, no hepatomegaly  Ext: no edema Musculoskeletal:  No deformities, BUE and BLE strength normal and equal Skin: warm and dry  Neuro:  CNs 2-12 intact, no focal abnormalities noted Psych:  Normal affect   EKG:  The EKG was personally reviewed  and demonstrates:   6/11 6:00 AM ECG is atrial fibrillation, heart rate 143 6/11 8:07 AM ECG is sinus rhythm, PVCs, heart rate 83, no acute ischemic changes  Telemetry:  Telemetry was personally reviewed and demonstrates: Atrial fibrillation>> spontaneous conversion to sinus rhythm, PVCs and pairs   CV studies:   ECHO: results pending  Laboratory Data:   Chemistry Recent Labs  Lab 03/10/20 0619  NA 140  K 3.6  CL 111  CO2 21*  GLUCOSE 142*  BUN 10  CREATININE 1.02  CALCIUM 7.9*  GFRNONAA >60  GFRAA >60  ANIONGAP 8    Lab Results  Component Value Date   ALT 21 03/10/2020   AST 46 (H) 03/10/2020   ALKPHOS 55 03/10/2020   BILITOT 1.5 (H) 03/10/2020   Hematology Recent Labs  Lab 03/10/20 0619  WBC 7.7  RBC 3.47*  HGB 10.2*  HCT 31.0*  MCV 89.3  MCH 29.4  MCHC 32.9  RDW 16.4*  PLT 211   Cardiac Enzymes High Sensitivity Troponin:   Recent Labs  Lab 03/10/20 0619 03/10/20 0844  TROPONINIHS 4,585* 4,405*      BNPNo results for input(s): BNP, PROBNP in the last 168 hours.  DDimer No results for input(s): DDIMER in the last 168 hours. TSH: No results found for: TSH Lipids:No results found for: CHOL, HDL, LDLCALC, LDLDIRECT, TRIG, CHOLHDL HgbA1c:No results found for: HGBA1C Magnesium:  Magnesium  Date Value Ref Range Status  03/10/2020 1.5 (L) 1.7 - 2.4 mg/dL Final    Comment:    Performed at [facility], [place] [address] [place], [place] [address]     Radiology/Studies:  No results found.  Assessment and Plan:   1.  GI bleed: -He has been seen by the GI  team and a capsule endoscopy is currently underway -On IV Protonix  -Management per IM/GI  2. PAF: -Patient spontaneously converted to sinus rhythm after being started on IV amiodarone -CHA2DS2-VASc is 5 (age x 1, DM, HTN, CVA x 2) -Anticoagulation is indicated, but risks outweigh benefits until/unless the source of bleeding is found and fixed -Follow-up on echo results  3.   Non-STEMI: -Cardiac enzymes are elevated, but in the setting of rapid atrial fibrillation -No reported history of exertional chest pain although exertion level at home was very low. -Follow-up on echo results and decide on further evaluation -With current bleeding issues, doubt cath indicated as we could not put him on DAPT  4.?  PAD -Review physical exam with MD and advise if further eval indicated at this time   Otherwise, per IM Principal Problem:   GI bleed Active Problems:   AF (paroxysmal atrial fibrillation) (HCC)   Essential hypertension   SIRS (systemic inflammatory response syndrome) (HCC)   Hypomagnesemia   History of CVA (cerebrovascular accident)   HLD (hyperlipidemia)   Tobacco abuse   For questions or updates, please contact [facility] Please consult [email or web address] for contact info under Cardiology/STEMI.   Jonetta Speak, PA-C  03/10/2020 12:32 PM

## 2020-03-10 NOTE — Progress Notes (Signed)
  Echocardiogram 2D Echocardiogram with Definity has been performed.  Corey Esparza 03/10/2020, 1:05 PM

## 2020-03-10 NOTE — Progress Notes (Signed)
Called Radiology department to confrim Xray of knee would be completed with portable and that patient did not need to be taken to radiology, was told a portable would be dispatched to come get the Xray.

## 2020-03-11 LAB — CBC
HCT: 28.9 % — ABNORMAL LOW (ref 39.0–52.0)
Hemoglobin: 9.4 g/dL — ABNORMAL LOW (ref 13.0–17.0)
MCH: 28.8 pg (ref 26.0–34.0)
MCHC: 32.5 g/dL (ref 30.0–36.0)
MCV: 88.7 fL (ref 80.0–100.0)
Platelets: 206 10*3/uL (ref 150–400)
RBC: 3.26 MIL/uL — ABNORMAL LOW (ref 4.22–5.81)
RDW: 16.9 % — ABNORMAL HIGH (ref 11.5–15.5)
WBC: 6.9 10*3/uL (ref 4.0–10.5)
nRBC: 0 % (ref 0.0–0.2)

## 2020-03-11 LAB — GLUCOSE, CAPILLARY
Glucose-Capillary: 122 mg/dL — ABNORMAL HIGH (ref 70–99)
Glucose-Capillary: 117 mg/dL — ABNORMAL HIGH (ref 70–99)
Glucose-Capillary: 135 mg/dL — ABNORMAL HIGH (ref 70–99)
Glucose-Capillary: 120 mg/dL — ABNORMAL HIGH (ref 70–99)

## 2020-03-11 LAB — BASIC METABOLIC PANEL
Anion gap: 10 (ref 5–15)
BUN: 8 mg/dL (ref 8–23)
CO2: 20 mmol/L — ABNORMAL LOW (ref 22–32)
Calcium: 7.8 mg/dL — ABNORMAL LOW (ref 8.9–10.3)
Chloride: 108 mmol/L (ref 98–111)
Creatinine, Ser: 1.08 mg/dL (ref 0.61–1.24)
GFR calc Af Amer: >60 mL/min (ref >60)
GFR calc non Af Amer: >60 mL/min (ref >60)
Glucose, Bld: 132 mg/dL — ABNORMAL HIGH (ref 70–99)
Potassium: 3.5 mmol/L (ref 3.5–5.1)
Sodium: 138 mmol/L (ref 135–145)

## 2020-03-11 LAB — SARS CORONAVIRUS 2 BY RT PCR (HOSPITAL ORDER, PERFORMED IN ~~LOC~~ HOSPITAL LAB): SARS Coronavirus 2: NEGATIVE

## 2020-03-11 MED ORDER — LISINOPRIL 10 MG PO TABS
10.0000 mg | ORAL_TABLET | Freq: Every day | ORAL | Status: DC
  Administered 2020-03-12 – 2020-03-15 (×4): 10 mg via ORAL
  Filled 2020-03-11 (×4): qty 1

## 2020-03-11 MED ORDER — MAGNESIUM OXIDE 400 (241.3 MG) MG PO TABS
400.0000 mg | ORAL_TABLET | Freq: Two times a day (BID) | ORAL | Status: DC
  Administered 2020-03-11 – 2020-03-15 (×9): 400 mg via ORAL
  Filled 2020-03-11 (×9): qty 1

## 2020-03-11 MED ORDER — FUROSEMIDE 20 MG PO TABS
20.0000 mg | ORAL_TABLET | Freq: Every day | ORAL | Status: DC
  Administered 2020-03-12 – 2020-03-15 (×4): 20 mg via ORAL
  Filled 2020-03-11 (×4): qty 1

## 2020-03-11 MED ORDER — HYDROCHLOROTHIAZIDE 25 MG PO TABS
25.0000 mg | ORAL_TABLET | Freq: Every day | ORAL | Status: DC
  Administered 2020-03-12 – 2020-03-15 (×4): 25 mg via ORAL
  Filled 2020-03-11 (×4): qty 1

## 2020-03-11 MED ORDER — ATORVASTATIN CALCIUM 40 MG PO TABS
40.0000 mg | ORAL_TABLET | Freq: Every day | ORAL | Status: DC
  Administered 2020-03-12 – 2020-03-14 (×3): 40 mg via ORAL
  Filled 2020-03-11 (×3): qty 1

## 2020-03-11 MED ORDER — AMIODARONE HCL 200 MG PO TABS
200.0000 mg | ORAL_TABLET | Freq: Two times a day (BID) | ORAL | Status: DC
  Administered 2020-03-11 – 2020-03-15 (×9): 200 mg via ORAL
  Filled 2020-03-11 (×9): qty 1

## 2020-03-11 MED ORDER — METOPROLOL TARTRATE 25 MG PO TABS
25.0000 mg | ORAL_TABLET | Freq: Two times a day (BID) | ORAL | Status: DC
  Administered 2020-03-11 – 2020-03-15 (×8): 25 mg via ORAL
  Filled 2020-03-11 (×8): qty 1

## 2020-03-11 MED ORDER — ALBUTEROL SULFATE (2.5 MG/3ML) 0.083% IN NEBU
2.5000 mg | INHALATION_SOLUTION | RESPIRATORY_TRACT | Status: DC | PRN
  Administered 2020-03-13: 2.5 mg via RESPIRATORY_TRACT
  Filled 2020-03-11: qty 3

## 2020-03-11 MED ORDER — METOPROLOL TARTRATE 5 MG/5ML IV SOLN
5.0000 mg | INTRAVENOUS | Status: DC | PRN
  Administered 2020-03-11: 5 mg via INTRAVENOUS
  Filled 2020-03-11: qty 5

## 2020-03-11 NOTE — Progress Notes (Signed)
Message sent to Pharmacy regarding patients lasix and HCTZ not being ready to give at scheduled time and wondering if it will be ready prior to the next shift. Waiting response back from pharmacy.

## 2020-03-11 NOTE — Progress Notes (Signed)
Progress Note   Subjective   Doing well today, the patient denies CP or SOB.  No new concerns  Inpatient Medications    Scheduled Meds: . Chlorhexidine Gluconate Cloth  6 each Topical Daily  . insulin aspart  0-5 Units Subcutaneous QHS  . insulin aspart  0-9 Units Subcutaneous TID WC  . [START ON 03/13/2020] pantoprazole  40 mg Intravenous Q12H  . sodium chloride flush  3 mL Intravenous Q12H   Continuous Infusions: . amiodarone 30 mg/hr (03/11/20 0600)  . pantoprozole (PROTONIX) infusion 8 mg/hr (03/11/20 0600)   PRN Meds: acetaminophen **OR** acetaminophen, albuterol, morphine injection   Vital Signs    Vitals:   03/11/20 0500 03/11/20 0600 03/11/20 0700 03/11/20 0741  BP: 132/79 124/60 (!) 142/64   Pulse: 91 82 87   Resp: (!) 25 19 19    Temp:    98.9 F (37.2 C)  TempSrc:    Axillary  SpO2: 97% 95% 97%   Weight:      Height:        Intake/Output Summary (Last 24 hours) at 03/11/2020 0932 Last data filed at 03/11/2020 0600 Gross per 24 hour  Intake 1356.32 ml  Output 900 ml  Net 456.32 ml   Filed Weights   03/10/20 0533 03/10/20 1059  Weight: 90.2 kg 90.7 kg    Telemetry    sinus - Personally Reviewed  Physical Exam   GEN- The patient is well appearing, alert and oriented x 3 today.   Head- normocephalic, atraumatic Eyes-  Sclera clear, conjunctiva pink Ears- hearing intact Oropharynx- clear Neck- supple, Lungs-  normal work of breathing Heart- Regular rate and rhythm  GI- soft  Extremities- no clubbing, cyanosis, or edema     Labs    Chemistry Recent Labs  Lab 03/10/20 0619 03/11/20 0355  NA 140 138  K 3.6 3.5  CL 111 108  CO2 21* 20*  GLUCOSE 142* 132*  BUN 10 8  CREATININE 1.02 1.08  CALCIUM 7.9* 7.8*  PROT 5.4*  --   ALBUMIN 2.7*  --   AST 46*  --   ALT 21  --   ALKPHOS 55  --   BILITOT 1.5*  --   GFRNONAA >60 >60  GFRAA >60 >60  ANIONGAP 8 10     Hematology Recent Labs  Lab 03/10/20 0619 03/10/20 1348  03/11/20 0355  WBC 7.7  --  6.9  RBC 3.47*  --  3.26*  HGB 10.2* 9.4* 9.4*  HCT 31.0* 28.8* 28.9*  MCV 89.3  --  88.7  MCH 29.4  --  28.8  MCHC 32.9  --  32.5  RDW 16.4*  --  16.9*  PLT 211  --  206     Patient ID  Corey Esparza is a 74 y.o. male with a hx of PAF (supposed to be on Eliquis but not taking), DM, HTN, HLD, CVA (hospitalized 05/10-05/14/2021), who was transferred to [facility] from [facility] in [place]. He is being seen today for the evaluation of elevated troponin & Afib at the request of Dr Tamala Julian.  Assessment & Plan    1.  Paroxysmal atrial fibrillation Now maintaining sinus Convert IV amiodarone to oral Currently not on [place] due to GI bleed. Once GI evaluation is complete, hopefully we can resume DOAC therapy given recent stroke. chads2vasc score is 5.  2. Demand ischemia No further chest pain No acute ekg changes Likely due to demand ischemic in the setting of acute blood loss anemia. Would  consider eventual cardiac workup.  May be able to defer to an elective setting when he is more recovered from his GI bleed and recent stroke.  3. Hypertensive cardiovascular disease Stable No change required today  4. GI bleed  Per GI, capsule endoscopy has been placed Hopefully we can resume DOAC therapy soon, depending on findings.  5. ? PAD As per previous consult note, consider outpatient ABIs  Ok to transfer to telemetry if other medical issues allow Cardiology to see again on Monday unless new issues arise over the weekend.  Thompson Grayer MD, [facility] 03/11/2020 9:32 AM

## 2020-03-11 NOTE — Plan of Care (Signed)

## 2020-03-11 NOTE — Progress Notes (Signed)
Provider (Dr. Sloan Leiter)  states aware of tachycardia and will monitor patient for now.

## 2020-03-11 NOTE — Progress Notes (Signed)
Notified attending MD that patient was transitioned from IV amio to PO amio by cardiology team. Pt converted back to Afib with HR 110's to 120's. MD okay with patient still being transferred to progressive unit.

## 2020-03-11 NOTE — Progress Notes (Signed)
PROGRESS NOTE    Corey Esparza  OZY:[id number] DOB: 06/29/1946 DOA: 03/10/2020 PCP: Patient, No Pcp Per    Brief Narrative:  74 year old gentleman with history of paroxysmal A. 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  Patient was recently hospitalized on 5/10-5/14 to person [facility] where MRI showed acute infarction in the posterior limb of the left internal capsule along with multiple chronic CVA.  He was sent home on Eliquis and metoprolol.  Patient was found at home unable to take care of himself, extremely weak and was brought back to the hospital.  Repeat CT scan of the head did not show any evidence of new stroke.  Labs revealed hemoglobin 8.3 with positive Hemoccult.  Noted to be melanotic stool.  Hemoglobin dropped as low as 7.9, received total 2 units of PRBC.  6/10 overnight complained of chest pain or shortness of breath, found to have A. fib with RVR and troponin elevated to 6.6.  Mild temperature 100.8.  Patient was transferred to [facility] for cardiology and further endoscopic care.  Seen and followed by cardiology and GI.   Assessment & Plan:   Principal Problem:   GI bleed Active Problems:   AF (paroxysmal atrial fibrillation) (HCC)   Essential hypertension   SIRS (systemic inflammatory response syndrome) (HCC)   Hypomagnesemia   History of CVA (cerebrovascular accident)   HLD (hyperlipidemia)   Tobacco abuse  GI bleeding: Unknown source.  EGD and colonoscopy done at person [facility], no significant source of bleeding. 6/11- capsule endoscopy done, results pending, remains on protonix and stable.  Depending upon the outcome from GI, patient will need to go back on anticoagulation.  Hemoglobin has remained stable.  Continue to monitor.  A. fib with  RVR, elevated troponin and suspected non-STEMI: Reason for transfer to this hospital.  Currently remains asymptomatic and chest pain-free. Started on amiodarone infusion, converted to sinus rhythm, not on anticoagulation.  Hoping to change to oral amiodarone today.  Followed by cardiology.  Ischemic evaluation pending, probable ischemic evaluation after GI issues settles.  Left MCA stroke with right hemiparesis: Work with PT OT.  Patient will benefit with inpatient therapies.  SNF versus acute rehab. Resume all his antihypertensives.  Resume atorvastatin.  Holding off on anticoagulation pending GI findings.  Hypomagnesemia: With poor appetite.  Replace aggressively.  Recheck levels tomorrow.  Type 2 diabetes on Metformin: Holding Metformin.  Currently on sliding scale insulin.  Resume Metformin on discharge.  DVT prophylaxis: SCD Code Status: Full code Family Communication: We will communicate with patient's daughter Disposition Plan: Status is: Inpatient  Remains inpatient appropriate because:Ongoing diagnostic testing needed not appropriate for outpatient work up and IV treatments appropriate due to intensity of illness or inability to take PO   Dispo: The patient is from: Home              Anticipated d/c is to: SNF              Anticipated d/c date is: 2 days              Patient currently is not medically stable to d/c.         Consultants:   Gastroenterology  Cardiology  Procedures:   Capsule endoscopy, results pending  Antimicrobials:   Antibiotics.  Discontinued   Subjective: Patient was  seen and examined.  No overnight events.  He stated being thirsty and was looking for water.  He has weakness on his right hand but denies any other complaints.  Denies any chest pain or shortness of breath.  Denies any palpitations. Telemetry shows sinus rhythm.  Objective: Vitals:   03/11/20 0500 03/11/20 0600 03/11/20 0700 03/11/20 0741  BP: 132/79 124/60 (!) 142/64     Pulse: 91 82 87   Resp: (!) 25 19 19    Temp:    98.9 F (37.2 C)  TempSrc:    Axillary  SpO2: 97% 95% 97%   Weight:      Height:        Intake/Output Summary (Last 24 hours) at 03/11/2020 0939 Last data filed at 03/11/2020 0600 Gross per 24 hour  Intake 1356.32 ml  Output 900 ml  Net 456.32 ml   Filed Weights   03/10/20 0533 03/10/20 1059  Weight: 90.2 kg 90.7 kg    Examination:  General exam: Appears calm and comfortable, chronically sick looking gentleman on room air. Respiratory system: No added sounds. Cardiovascular system: S1 & S2 heard, RRR. No pedal edema. Gastrointestinal system: Abdomen is nondistended, soft and nontender. No organomegaly or masses felt. Normal bowel sounds heard. Central nervous system: Alert and oriented.  Right lower extremity power 2/5, unable to lift up against gravity.  Other motor power is normal. Skin: No rashes, lesions or ulcers Psychiatry: Judgement and insight appear normal. Mood & affect appropriate.     Data Reviewed: I have personally reviewed following labs and imaging studies  CBC: Recent Labs  Lab 03/10/20 0619 03/10/20 1348 03/11/20 0355  WBC 7.7  --  6.9  NEUTROABS 5.3  --   --   HGB 10.2* 9.4* 9.4*  HCT 31.0* 28.8* 28.9*  MCV 89.3  --  88.7  PLT 211  --  010   Basic Metabolic Panel: Recent Labs  Lab 03/10/20 0619 03/10/20 0844 03/11/20 0355  NA 140  --  138  K 3.6  --  3.5  CL 111  --  108  CO2 21*  --  20*  GLUCOSE 142*  --  132*  BUN 10  --  8  CREATININE 1.02  --  1.08  CALCIUM 7.9*  --  7.8*  MG  --  1.5*  --    GFR: Estimated Creatinine Clearance: 69.2 mL/min (by C-G formula based on SCr of 1.08 mg/dL). Liver Function Tests: Recent Labs  Lab 03/10/20 0619  AST 46*  ALT 21  ALKPHOS 55  BILITOT 1.5*  PROT 5.4*  ALBUMIN 2.7*   No results for input(s): LIPASE, AMYLASE in the last 168 hours. No results for input(s): AMMONIA in the last 168 hours. Coagulation Profile: No results for  input(s): INR, PROTIME in the last 168 hours. Cardiac Enzymes: No results for input(s): CKTOTAL, CKMB, CKMBINDEX, TROPONINI in the last 168 hours. BNP (last 3 results) No results for input(s): PROBNP in the last 8760 hours. HbA1C: No results for input(s): HGBA1C in the last 72 hours. CBG: Recent Labs  Lab 03/10/20 0526 03/10/20 1531 03/10/20 2125 03/11/20 0744  GLUCAP 130* 114* 124* 122*   Lipid Profile: No results for input(s): CHOL, HDL, LDLCALC, TRIG, CHOLHDL, LDLDIRECT in the last 72 hours. Thyroid Function Tests: No results for input(s): TSH, T4TOTAL, FREET4, T3FREE, THYROIDAB in the last 72 hours. Anemia Panel: No results for input(s): VITAMINB12, FOLATE, FERRITIN, TIBC, IRON, RETICCTPCT in the last 72 hours. Sepsis Labs: No results for input(s):  PROCALCITON, LATICACIDVEN in the last 168 hours.  Recent Results (from the past 240 hour(s))  MRSA PCR Screening     Status: None   Collection Time: 03/10/20  5:41 AM   Specimen: Nasal Mucosa; Nasopharyngeal  Result Value Ref Range Status   MRSA by PCR NEGATIVE NEGATIVE Final    Comment:        The GeneXpert MRSA Assay (FDA approved for NASAL specimens only), is one component of a comprehensive MRSA colonization surveillance program. It is not intended to diagnose MRSA infection nor to guide or monitor treatment for MRSA infections. Performed at [facility], [place] [address] [place], [place] [address]          Radiology Studies: DG Knee 1-2 Views Right  Result Date: 03/10/2020 CLINICAL DATA:  RIGHT knee pain EXAM: RIGHT KNEE - 1-2 VIEW COMPARISON:  02/09/2020 FINDINGS: Advanced degenerative change in the RIGHT knee with complete loss of the joint space in the lateral compartment. Patellofemoral osteoarthritic changes as well as marked narrowing of the joint space in the medial compartment. Lucency with central increased density in the proximal RIGHT tibia measuring 2.5 x 1.9 cm. Slight enlargement of joint  effusion. No acute fracture or dislocation. IMPRESSION: 1. 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 2. 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 There is history of malignancy MRI or CT for further assessment could be considered. 3. Slight enlargement of joint effusion. Electronically Signed   By: Zetta Bills M.D.   On: 03/10/2020 21:54   ECHOCARDIOGRAM COMPLETE  Result Date: 03/10/2020    ECHOCARDIOGRAM REPORT   Patient Name:   AAYANSH CODISPOTI Date of Exam: 03/10/2020 Medical Rec #:  [id number]       Height:       71.0 in Accession #:    [id number]      Weight:       200.0 lb Date of Birth:  [birth year]        BSA:          2.109 m Patient Age:    1 years        BP:           117/64 mmHg Patient Gender: M               HR:           78 bpm. Exam Location:  Inpatient Procedure: 2D Echo, Cardiac Doppler, Color Doppler and Intracardiac            Opacification Agent Indications:    Elevated troponin  History:        Patient has no prior history of Echocardiogram examinations.  Sonographer:    Clayton Lefort RDCS (AE) Referring Phys: [id number] [facility] SM[facility]  Sonographer Comments: Suboptimal parasternal window. Image acquisition challenging due to respiratory motion. IMPRESSIONS  1. Left ventricular ejection fraction, by estimation, is 40 to 45%. The left ventricle has mildly decreased function. The left ventricle has no regional wall motion abnormalities. There is moderate left ventricular hypertrophy. Left ventricular diastolic parameters are consistent with Grade I diastolic dysfunction (impaired relaxation).  2. Right ventricular systolic function is normal. The right ventricular size is normal.  3. The mitral valve is normal in structure. No evidence of mitral valve regurgitation. No evidence of mitral stenosis.  4. The aortic valve is tricuspid. Aortic valve regurgitation is not visualized. Mild to moderate aortic valve sclerosis/calcification is present, without any evidence of aortic stenosis.  5. The  inferior vena cava is dilated  in size with <50% respiratory variability, suggesting right atrial pressure of 15 mmHg. FINDINGS  Left Ventricle: Left ventricular ejection fraction, by estimation, is 40 to 45%. The left ventricle has mildly decreased function. The left ventricle has no regional wall motion abnormalities. Definity contrast agent was given IV to delineate the left ventricular endocardial borders. The left ventricular internal cavity size was normal in size. There is moderate left ventricular hypertrophy. Left ventricular diastolic parameters are consistent with Grade I diastolic dysfunction (impaired relaxation).  LV Wall Scoring: The mid anteroseptal segment and mid anterior segment are akinetic. Right Ventricle: The right ventricular size is normal. No increase in right ventricular wall thickness. Right ventricular systolic function is normal. Left Atrium: Left atrial size was normal in size. Right Atrium: Right atrial size was normal in size. Pericardium: There is no evidence of pericardial effusion. Mitral Valve: The mitral valve is normal in structure. Normal mobility of the mitral valve leaflets. Moderate mitral annular calcification. No evidence of mitral valve regurgitation. No evidence of mitral valve stenosis. MV peak gradient, 4.4 mmHg. The mean mitral valve gradient is 2.0 mmHg. Tricuspid Valve: The tricuspid valve is normal in structure. Tricuspid valve regurgitation is not demonstrated. No evidence of tricuspid stenosis. Aortic Valve: The aortic valve is tricuspid. . There is moderate thickening and moderate calcification of the aortic valve. Aortic valve regurgitation is not visualized. Mild to moderate aortic valve sclerosis/calcification is present, without any evidence of aortic stenosis. There is moderate thickening of the aortic valve. There is moderate calcification of the aortic valve. Aortic valve mean gradient measures 3.0 mmHg. Aortic valve peak gradient measures 4.9 mmHg.  Aortic valve area, by VTI measures 2.74 cm. Pulmonic Valve: The pulmonic valve was normal in structure. Pulmonic valve regurgitation is not visualized. No evidence of pulmonic stenosis. Aorta: The aortic root is normal in size and structure. Venous: The inferior vena cava is dilated in size with less than 50% respiratory variability, suggesting right atrial pressure of 15 mmHg. IAS/Shunts: No atrial level shunt detected by color flow Doppler.  LEFT VENTRICLE PLAX 2D LVIDd:         4.10 cm LVIDs:         3.20 cm LV PW:         1.60 cm LV IVS:        1.70 cm LVOT diam:     2.00 cm LV SV:         62 LV SV Index:   29 LVOT Area:     3.14 cm  RIGHT VENTRICLE             IVC RV Basal diam:  2.60 cm     IVC diam: 2.50 cm RV S prime:     10.80 cm/s TAPSE (M-mode): 1.6 cm LEFT ATRIUM             Index       RIGHT ATRIUM           Index LA diam:        3.90 cm 1.85 cm/m  RA Area:     17.30 cm LA Vol (A2C):   61.6 ml 29.21 ml/m RA Volume:   51.60 ml  24.47 ml/m LA Vol (A4C):   53.6 ml 25.42 ml/m LA Biplane Vol: 60.9 ml 28.88 ml/m  AORTIC VALVE AV Area (Vmax):    2.43 cm AV Area (Vmean):   2.27 cm AV Area (VTI):     2.74 cm AV Vmax:  111.00 cm/s AV Vmean:          85.100 cm/s AV VTI:            0.225 m AV Peak Grad:      4.9 mmHg AV Mean Grad:      3.0 mmHg LVOT Vmax:         85.70 cm/s LVOT Vmean:        61.600 cm/s LVOT VTI:          0.196 m LVOT/AV VTI ratio: 0.87  AORTA Ao Root diam: 3.10 cm MITRAL VALVE MV Area (PHT): 3.55 cm  SHUNTS MV Peak grad:  4.4 mmHg  Systemic VTI:  0.20 m MV Mean grad:  2.0 mmHg  Systemic Diam: 2.00 cm MV Vmax:       1.05 m/s MV Vmean:      63.0 cm/s Candee Furbish MD Electronically signed by Candee Furbish MD Signature Date/Time: 03/10/2020/2:17:06 PM    Final         Scheduled Meds: . amiodarone  200 mg Oral BID  . Chlorhexidine Gluconate Cloth  6 each Topical Daily  . insulin aspart  0-5 Units Subcutaneous QHS  . insulin aspart  0-9 Units Subcutaneous TID WC  .  magnesium oxide  400 mg Oral BID  . [START ON 03/13/2020] pantoprazole  40 mg Intravenous Q12H  . sodium chloride flush  3 mL Intravenous Q12H   Continuous Infusions: . pantoprozole (PROTONIX) infusion 8 mg/hr (03/11/20 0600)     LOS: 1 day    Time spent: 30 minutes    Barb Merino, MD [facility] Pager [phone]

## 2020-03-11 NOTE — Progress Notes (Addendum)
During transfer to other unit patient states that he can not find his wallet but that he had it upon his transfer from outside facility to this facility. Patient belongings bag and room checked with no wallet found. Spoke with patient's daughter, Karrie Doffing, to notify her of patient's transfer to other unit and she states that patient left his wallet it at home and did not have it at either hospital.

## 2020-03-11 NOTE — Progress Notes (Signed)
Patient arrived to room 35. Patient is alert & oriented .

## 2020-03-11 NOTE — Progress Notes (Signed)
@   approx 2301 Notified by CN Nikki, pt HR sustaining 150's -160's, pt asymptomatic, obtained order for lopressor, administered 5mg  iv of metoprolol pt converted to sr HR 80's.

## 2020-03-11 NOTE — Progress Notes (Signed)
Notified Dr.  Sloan Leiter that patient MEWS  Score is at Yellow at this time. Waiting message back.

## 2020-03-11 NOTE — Progress Notes (Signed)
Patient had been initially started on empiric antibiotics of metronidazole and Rocephin at the outside facility after found to have fever meeting SIRS criteria.  Prior chest x-ray and CT imaging of the abdomen did not show any acute findings to suggest infection.  PT/OT and transitions of care consulted for likely need of placement.

## 2020-03-11 NOTE — Progress Notes (Deleted)
Patient had been initially started on empiric antibiotics of metronidazole and Rocephin at the outside facility after found to have fever meeting SIRS criteria.  Prior chest x-ray and CT imaging of the abdomen did not show any acute findings to suggest infection.  PT/OT and transitions of care consulted for likely need of placement.

## 2020-03-11 NOTE — Progress Notes (Addendum)
Pt feels ok, no overt bleeding.  BUN remains normal.    Hemoglobin shows mild decrease overnight, from 10.2  To 9.4.    Capsule study currently being downloaded.  We hope to have it read either this evening or tomorrow.  Please call me in the meantime if input from [place] is needed.  Cleotis Nipper, M.D. Pager [phone] If no answer or after 5 PM call [phone]

## 2020-03-11 NOTE — Evaluation (Signed)
Physical Therapy Evaluation Patient Details Name: Corey Esparza MRN: [id number] DOB: 12/27/1945 Today's Date: 03/11/2020   History of Present Illness  74 y.o. male with medical history significant of PAF on Eliquis, CVA, HTN, HLD, and DM2 presented to person [facility] on 6/7 with complaints of weakness and inability to care for himself at home. 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 Transferred to [facility] 03/10/20 due to afib with RVR, rising troponins, and ?need for cardiac intervention. Converted to NSR with amiodorone. GI consult due to melena, anemia.   Clinical Impression   Pt admitted with above diagnosis. Patient with very arthritic and painful knees, with report that his left knee (which was his better knee) is now very painful after several falls. He also has residual weakness in RLE from Lt CVA in May 2021. Patient currently will require 2 person assist to even mobilize to sit EOB. He will require maximized pain control regimen to allow progress towards standing/walking. Does not currently tolerate even 15 minutes of active therapy. Pt currently with functional limitations due to the deficits listed below (see PT Problem List). Pt will benefit from skilled PT to increase their independence and safety with mobility to allow discharge to the venue listed below.       Follow Up Recommendations SNF;Supervision/Assistance - 24 hour    Equipment Recommendations  Other (comment) (TBD at next venue)    Recommendations for Other Services       Precautions / Restrictions Precautions Precautions: Fall Precaution Comments: reports multiple falls since he went home after CVA      Mobility  Bed Mobility Overal bed mobility: Needs Assistance             General bed mobility comments: pt unable to tolerate even change in [facility] position; states he was "moved all kinds of ways"  when transferred to new room and knees are too painful to move  Transfers                 General transfer comment: RN educated pt will need lift if to get OOB  Ambulation/Gait                Stairs            Wheelchair Mobility    Modified Rankin (Stroke Patients Only)       Balance Overall balance assessment: History of Falls                                           Pertinent Vitals/Pain Pain Assessment: Faces Faces Pain Scale: Hurts worst Pain Location: bil knees; left worse Pain Descriptors / Indicators: Grimacing;Crying Pain Intervention(s): Limited activity within patient's tolerance;Repositioned;Relaxation    Home Living Family/patient expects to be discharged to:: Private residence Living Arrangements: Alone Available Help at Discharge: Family;Friend(s);Available PRN/intermittently (daughter in school?) Type of Home: House Home Access: Stairs to enter   [employer] of Steps: 1 Home Layout: Two level Home Equipment: Cane - single point;Walker - 2 wheels;Shower seat - built in      Prior Function Level of Independence: Needs assistance      ADL's / Homemaking Assistance Needed: no longer drives; friends/family   help with errands/groceries        Hand Dominance   Dominant Hand: Right  Extremity/Trunk Assessment   Upper Extremity Assessment Upper Extremity Assessment: Defer to OT evaluation    Lower Extremity Assessment Lower Extremity Assessment: RLE deficits/detail;LLE deficits/detail RLE Deficits / Details: rt knee very swollen/arthritic changees; PROM lacking full knee extension (~10 degrees), only tolerated flexion to 45 degrees, ankle PROM WFL, DF 2+, PF 0/5 RLE: Unable to fully assess due to pain RLE Sensation: decreased light touch (able to accurately state location; reports numb compared Lt) LLE Deficits / Details: Left knee with scab/scrape; PROM to full knee extension, flexion to only  20 due to pain; ankle WFL with 5/5 DF LLE Sensation: WNL    Cervical / Trunk Assessment Cervical / Trunk Assessment: Other exceptions Cervical / Trunk Exceptions: overweight  Communication   Communication: HOH  Cognition Arousal/Alertness: Awake/alert Behavior During Therapy:  (labile; crying) Overall Cognitive Status: No family/caregiver present to determine baseline cognitive functioning                                 General Comments: pt oriented to person, place, situation, time NT;       General Comments General comments (skin integrity, edema, etc.): HR 107-112 bpm afib. Patient educated on importance of continuing to perform ROM with bil knees to prevent further arthritis pain. Educated on role of PT and post-acute venues with recommendation for SNF. Patient very labile with crying numerous times during session. He reports this started after his stroke. Twice he did become tearful with attempts to move his legs which he reports are very painful. Reports RN gave him tylenol (which is what he takes at home)    Exercises     Assessment/Plan    PT Assessment Patient needs continued PT services  PT Problem List Decreased strength;Decreased range of motion;Decreased activity tolerance;Decreased balance;Decreased mobility;Decreased knowledge of use of DME;Decreased safety awareness;Cardiopulmonary status limiting activity;Impaired sensation;Obesity;Pain       PT Treatment Interventions DME instruction;Gait training;Functional mobility training;Therapeutic activities;Therapeutic exercise;Balance training;Neuromuscular re-education;Patient/family education    PT Goals (Current goals can be found in the Care Plan section)  Acute Rehab PT Goals Patient Stated Goal: to have less pain in his knees; to stop falling PT Goal Formulation: With patient Time For Goal Achievement: 03/25/20 Potential to Achieve Goals: Fair    Frequency Min 2X/week   Barriers to discharge  Decreased caregiver support      Co-evaluation               AM-PAC PT "6 Clicks" Mobility  Outcome Measure Help needed turning from your back to your side while in a flat bed without using bedrails?: Total Help needed moving from lying on your back to sitting on the side of a flat bed without using bedrails?: Total Help needed moving to and from a bed to a chair (including a wheelchair)?: Total Help needed standing up from a chair using your arms (e.g., wheelchair or bedside chair)?: Total Help needed to walk in hospital room?: Total Help needed climbing 3-5 steps with a railing? : Total 6 Click Score: 6    End of Session Equipment Utilized During Treatment: Oxygen Activity Tolerance: Patient limited by pain Patient left: in bed;with call bell/phone within reach;with bed alarm set Nurse Communication: Mobility status;Need for lift equipment PT Visit Diagnosis: Repeated falls (R29.6);Pain;Hemiplegia and hemiparesis Hemiplegia - Right/Left: Right Hemiplegia - dominant/non-dominant: Dominant Hemiplegia - caused by: Cerebral infarction Pain - Right/Left:  (bil) Pain - part of body: Knee  Time: 7948-0165 PT Time Calculation (min) (ACUTE ONLY): 25 min   Charges:   PT Evaluation $PT Eval Low Complexity: 1 Low PT Treatments $Self Care/Home Management: 8-22         Arby Barrette, PT Pager [phone]   Rexanne Mano 03/11/2020, 5:47 PM

## 2020-03-12 ENCOUNTER — Encounter (HOSPITAL_COMMUNITY): Payer: Self-pay | Admitting: Gastroenterology

## 2020-03-12 LAB — CBC WITH DIFFERENTIAL/PLATELET
Abs Immature Granulocytes: 0.03 10*3/uL (ref 0.00–0.07)
Basophils Absolute: 0 10*3/uL (ref 0.0–0.1)
Basophils Relative: 0 %
Eosinophils Absolute: 0.2 10*3/uL (ref 0.0–0.5)
Eosinophils Relative: 3 %
HCT: 27.5 % — ABNORMAL LOW (ref 39.0–52.0)
Hemoglobin: 8.9 g/dL — ABNORMAL LOW (ref 13.0–17.0)
Immature Granulocytes: 1 %
Lymphocytes Relative: 15 %
Lymphs Abs: 1 10*3/uL (ref 0.7–4.0)
MCH: 28.5 pg (ref 26.0–34.0)
MCHC: 32.4 g/dL (ref 30.0–36.0)
MCV: 88.1 fL (ref 80.0–100.0)
Monocytes Absolute: 1 10*3/uL (ref 0.1–1.0)
Monocytes Relative: 16 %
Neutro Abs: 4.1 10*3/uL (ref 1.7–7.7)
Neutrophils Relative %: 65 %
Platelets: 197 10*3/uL (ref 150–400)
RBC: 3.12 MIL/uL — ABNORMAL LOW (ref 4.22–5.81)
RDW: 16.3 % — ABNORMAL HIGH (ref 11.5–15.5)
WBC: 6.3 10*3/uL (ref 4.0–10.5)
nRBC: 0 % (ref 0.0–0.2)

## 2020-03-12 LAB — COMPREHENSIVE METABOLIC PANEL
ALT: 14 U/L (ref 0–44)
AST: 19 U/L (ref 15–41)
Albumin: 2.3 g/dL — ABNORMAL LOW (ref 3.5–5.0)
Alkaline Phosphatase: 45 U/L (ref 38–126)
Anion gap: 9 (ref 5–15)
BUN: 8 mg/dL (ref 8–23)
CO2: 20 mmol/L — ABNORMAL LOW (ref 22–32)
Calcium: 7.8 mg/dL — ABNORMAL LOW (ref 8.9–10.3)
Chloride: 104 mmol/L (ref 98–111)
Creatinine, Ser: 1.01 mg/dL (ref 0.61–1.24)
GFR calc Af Amer: >60 mL/min (ref >60)
GFR calc non Af Amer: >60 mL/min (ref >60)
Glucose, Bld: 136 mg/dL — ABNORMAL HIGH (ref 70–99)
Potassium: 3.5 mmol/L (ref 3.5–5.1)
Sodium: 133 mmol/L — ABNORMAL LOW (ref 135–145)
Total Bilirubin: 1.2 mg/dL (ref 0.3–1.2)
Total Protein: 5 g/dL — ABNORMAL LOW (ref 6.5–8.1)

## 2020-03-12 LAB — PHOSPHORUS: Phosphorus: 2.3 mg/dL — ABNORMAL LOW (ref 2.5–4.6)

## 2020-03-12 LAB — MAGNESIUM: Magnesium: 1.6 mg/dL — ABNORMAL LOW (ref 1.7–2.4)

## 2020-03-12 LAB — GLUCOSE, CAPILLARY: Glucose-Capillary: 109 mg/dL — ABNORMAL HIGH (ref 70–99)

## 2020-03-12 MED ORDER — PANTOPRAZOLE SODIUM 40 MG PO TBEC
40.0000 mg | DELAYED_RELEASE_TABLET | Freq: Two times a day (BID) | ORAL | Status: DC
  Administered 2020-03-12 – 2020-03-15 (×7): 40 mg via ORAL
  Filled 2020-03-12 (×7): qty 1

## 2020-03-12 MED ORDER — DIPHENHYDRAMINE HCL 25 MG PO CAPS: 25.0000 mg | ORAL_CAPSULE | Freq: Once | ORAL | Status: DC

## 2020-03-12 MED ORDER — POTASSIUM & SODIUM PHOSPHATES 280-160-250 MG PO PACK
1.0000 | PACK | Freq: Three times a day (TID) | ORAL | Status: DC
  Administered 2020-03-12 – 2020-03-15 (×12): 1 via ORAL
  Filled 2020-03-12 (×15): qty 1

## 2020-03-12 MED ORDER — APIXABAN 5 MG PO TABS
5.0000 mg | ORAL_TABLET | Freq: Two times a day (BID) | ORAL | Status: DC
  Administered 2020-03-12 – 2020-03-15 (×7): 5 mg via ORAL
  Filled 2020-03-12 (×7): qty 1

## 2020-03-12 NOTE — TOC Progression Note (Addendum)
Transition of Care [facility]) - Progression Note    Patient Details  Name: Corey Esparza MRN: [id number] Date of Birth: 1945-12-11  Transition of Care [facility]) CM/SW [place],  Phone Number: 03/12/2020, 12:47 PM  Clinical Narrative:   CSW spoke with patient's daughter, Corey Esparza, to discuss recommendation for SNF. Jessica asked about CIR, and CSW explained that it didn't appear that the patient could tolerate the intensity at this time but could continue to evaluate his progress. Corey Esparza indicated that CIR would be her top choice, but would be agreeable to SNF if that was the only option. Corey Esparza said she's hopeful for placement near [place], but is not happy with the two SNF options in [place]. CSW provided information on next closest SNF options, and Corey Esparza will talk to her sister and get back to CSW with what they want to do. CSW sent out referral to options available closeby within the hub. CSW to follow.  CSW sent message to PT and OT assigned today that patient's family would prefer CIR if patient shows improvement in ability to tolerate intensity. They will reevaluate for any progress when they see him.  UPDATE: CSW heard back from [place] that they would like to see if patient can get into [facility] in [place], [place]. Patient has family in [place] area. CSW discussed transport for that far away and PTAR requiring private pay for transport out of 50 mile radius. CSW to reach out to [facility] to send referral.    Expected Discharge Plan: [place] Barriers to Discharge: Ship broker, Continued Medical Work up  Expected Discharge Plan and Services Expected Discharge Plan: [place] In-house Referral: Clinical Social Work Discharge Planning Services: CM Consult   Living arrangements for the past 2 months: Single Family Home                                       Social Determinants of Health (SDOH) Interventions     Readmission Risk Interventions No flowsheet data found.

## 2020-03-12 NOTE — Progress Notes (Signed)
Physical Therapy Treatment Patient Details Name: Corey Esparza MRN: [id number] DOB: May 15, 1946 Today's Date: 03/12/2020    History of Present Illness 74 y.o. male with medical history significant of PAF on Eliquis, CVA, HTN, HLD, and DM2 presented to person [facility] on 6/7 with complaints of weakness and inability to care for himself at home. 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 Transferred to [facility] 03/10/20 due to afib with RVR, rising troponins, and ?need for cardiac intervention. Converted to NSR with amiodorone. GI consult due to melena, anemia.     PT Comments    Patient seen in co-treatment with OT as need skilled assist of 2 people due to patients level of pain and degree of weakness. He was able to assist with all aspects of mobility, however requires mod to total assist. He was unable to stand from elevated bed with use of bed pad under pelvis (sling-like) and pt pushing down on RW with bil UEs. Performed lateral scoot to recliner (drop-arm) with 2 person max assist. Patient very pleased to be OOB.  Noted pt's daughter has inquired re: CIR level of care. Currently patient could not tolerate the intensity and hours of therapy per day due to bil knee pain and deconditioning.     Follow Up Recommendations  SNF;Supervision/Assistance - 24 hour     Equipment Recommendations  Other (comment) (TBD at next venue)    Recommendations for Other Services       Precautions / Restrictions Precautions Precautions: Fall Precaution Comments: reports multiple falls since he went home after CVA Restrictions Weight Bearing Restrictions: No    Mobility  Bed Mobility Overal bed mobility: Needs Assistance Bed Mobility: Rolling;Sidelying to Sit Rolling: Max assist Sidelying to sit: Mod assist;+2 for physical assistance;HOB elevated Supine to sit: Mod assist;+2 for physical  assistance;+2 for safety/equipment     General bed mobility comments: assisted with legs EOB, trunk elevation, and use of bed pad to bring hips EOB, great improvement in effort from yesterday's session with PT. Still requires significant assist  Transfers Overall transfer level: Needs assistance Equipment used: Rolling walker (2 wheeled);2 person hand held assist Transfers: Sit to/from [employer] Sit to Stand: Max assist;+2 physical assistance;+2 safety/equipment;From elevated surface (unable to achieve upright - only approx 1 inch off the bed)   Squat pivot transfers: Max assist;+2 physical assistance;+2 safety/equipment;From elevated surface (using drop arm recliner and HEAVY use of bed pad to facilita)     General transfer comment: RN educated pt will need lift/maximove (pad in room) to return to bed  Ambulation/Gait                 Stairs             Wheelchair Mobility    Modified Rankin (Stroke Patients Only)       Balance Overall balance assessment: History of Falls                                          Cognition Arousal/Alertness: Awake/alert Behavior During Therapy: WFL for tasks assessed/performed (only one occurance of crying today) Overall Cognitive Status: No family/caregiver present to determine baseline cognitive functioning  General Comments: pt oriented to person, place, situation, time NT;       Exercises General Exercises - Lower Extremity Long Arc Quad: AROM;Both;5 reps;10 reps;Seated    General Comments General comments (skin integrity, edema, etc.): on room air sats >93%       Pertinent Vitals/Pain Pain Assessment: Faces Faces Pain Scale: Hurts worst Pain Location: bil knees; left worse Pain Descriptors / Indicators: Grimacing;Constant;Tender Pain Intervention(s): Limited activity within patient's tolerance;Monitored during session;Repositioned     Home Living Family/patient expects to be discharged to:: Private residence Living Arrangements: Alone Available Help at Discharge: Family;Friend(s);Available PRN/intermittently (daughter in school?) Type of Home: House Home Access: Stairs to enter   Home Layout: Two level Home Equipment: Cane - single point;Walker - 2 wheels;Shower seat - built in      Prior Function Level of Independence: Needs assistance  Gait / Transfers Assistance Needed: typically walks with walker ADL's / Homemaking Assistance Needed: no longer drives; friends/family   help with errands/groceries; was doing his own ADL per patient report     PT Goals (current goals can now be found in the care plan section) Acute Rehab PT Goals Patient Stated Goal: to have less pain in his knees; to stop falling Time For Goal Achievement: 03/25/20 Potential to Achieve Goals: Fair Progress towards PT goals: Progressing toward goals    Frequency    Min 2X/week      PT Plan Current plan remains appropriate    Co-evaluation PT/OT/SLP Co-Evaluation/Treatment: Yes Reason for Co-Treatment: Complexity of the patient's impairments (multi-system involvement);For patient/therapist safety;To address functional/ADL transfers PT goals addressed during session: Mobility/safety with mobility;Balance;Proper use of DME OT goals addressed during session: ADL's and self-care;Proper use of Adaptive equipment and DME;Strengthening/ROM      AM-PAC PT "6 Clicks" Mobility   Outcome Measure  Help needed turning from your back to your side while in a flat bed without using bedrails?: A Lot Help needed moving from lying on your back to sitting on the side of a flat bed without using bedrails?: A Lot Help needed moving to and from a bed to a chair (including a wheelchair)?: Total Help needed standing up from a chair using your arms (e.g., wheelchair or bedside chair)?: Total Help needed to walk in hospital room?: Total Help needed  climbing 3-5 steps with a railing? : Total 6 Click Score: 8    End of Session Equipment Utilized During Treatment: Gait belt Activity Tolerance: Patient limited by pain Patient left: in chair;with chair alarm set;with call bell/phone within reach Nurse Communication: Mobility status;Need for lift equipment;Other (comment) (maximove pad in room) PT Visit Diagnosis: Repeated falls (R29.6);Pain;Hemiplegia and hemiparesis Hemiplegia - Right/Left: Right Hemiplegia - dominant/non-dominant: Dominant Hemiplegia - caused by: Cerebral infarction Pain - Right/Left:  (bil) Pain - part of body: Knee     Time: 1123-1200 PT Time Calculation (min) (ACUTE ONLY): 37 min  Charges:  $Therapeutic Activity: 8-22 mins                      Arby Barrette, PT Pager [phone]    Rexanne Mano 03/12/2020, 3:05 PM

## 2020-03-12 NOTE — Progress Notes (Signed)
PROGRESS NOTE    Corey Esparza  YME:[id number] DOB: June 17, 1946 DOA: 03/10/2020 PCP: Patient, No Pcp Per    Brief Narrative:  74 year old gentleman with history of paroxysmal A. 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  Patient was recently hospitalized on 5/10-5/14 to person [facility] where MRI showed acute infarction in the posterior limb of the left internal capsule along with multiple chronic CVA.  He was sent home on Eliquis and metoprolol.  Patient was found at home unable to take care of himself, extremely weak and was brought back to the hospital.  Repeat CT scan of the head did not show any evidence of new stroke.  Labs revealed hemoglobin 8.3 with positive Hemoccult.  Noted to be melanotic stool.  Hemoglobin dropped as low as 7.9, received total 2 units of PRBC.  6/10 overnight complained of chest pain or shortness of breath, found to have A. fib with RVR and troponin elevated to 6.6.  Mild temperature 100.8.  Patient was transferred to [facility] for cardiology and further endoscopic care.  Seen and followed by cardiology and GI.   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 EGD and colonoscopy done at person [facility], no significant source of bleeding. 6/11- capsule endoscopy done, found to have vascular ectasias.  No major bleeding.  Hemoglobin is stabilized. We will challenge with Eliquis today, recheck hemoglobin more frequently even after  discharge.  Paroxysmal A. fib with RVR, elevated troponin and suspected non-STEMI: Reason for transfer to this hospital.  Currently remains asymptomatic and chest pain-free. Treated with amiodarone, converted to sinus rhythm.  Is still intermittent A. fib.   Rate controlled on metoprolol and amiodarone.  Appreciate cardiology input.  Resume Eliquis.  Probable ischemic evaluation on follow-up.  Left MCA stroke with right hemiparesis:  Work with PT OT.  Patient will benefit with inpatient therapies.  SNF versus acute rehab. Resumed all his antihypertensives.  Resumed atorvastatin.  Resume Eliquis.  Hypomagnesemia: With poor appetite.  Replaced IV.  Keep on oral scheduled replacement.  Type 2 diabetes on Metformin: Holding Metformin.  Currently on sliding scale insulin.  Resume Metformin on discharge.  DVT prophylaxis: SCD Code Status: Full code Family Communication: Patient's daughter on the phone. Disposition Plan: Status is: Inpatient  Remains inpatient appropriate because: Ongoing bleeding, challenged with Eliquis, needs monitoring.  Dispo: The patient is from: Home              Anticipated d/c is to: SNF              Anticipated d/c date is: 2 days              Patient currently is not medically stable to d/c.  Is a stabilizing.  Will need to look for a skilled nursing rehab for discharge.   Consultants:   Gastroenterology  Cardiology  Procedures:   Capsule endoscopy,  Antimicrobials:   Antibiotics.  Discontinued   Subjective: Patient seen and examined.  No overnight events.  He does have  knee pain after frequent fall.  Telemetry shows sinus rhythm.  He had episodes of A. fib at night.  Mostly rate controlled. Call placed and discussed with patient's daughter who was really hoping that he will qualify to go to CIR, we explained that he may not be able to tolerate therapies at CIR. Looking for a skilled nursing facilities. If patient has to be transported out of town,  will allow family members to transport him.  Objective: Vitals:   03/11/20 2307 03/11/20 2323 03/12/20 0400 03/12/20 0700  BP:  133/85 (!) 123/58 (!) 119/57  Pulse: (!) 147 (!) 130 70 69  Resp: (!) 22 (!) 22 (!) 21 18  Temp:    97.8 F (36.6 C)  TempSrc:    Oral  SpO2: 96% 95% 99% 94%  Weight:      Height:        Intake/Output Summary (Last 24 hours) at 03/12/2020 1519 Last data filed at 03/12/2020 1217 Gross per 24 hour  Intake 120 ml  Output --  Net 120 ml   Filed Weights   03/10/20 0533 03/10/20 1059  Weight: 90.2 kg 90.7 kg    Examination:  General exam: Appears calm and comfortable, chronically sick looking gentleman on room air. Respiratory system: No added sounds. Cardiovascular system: S1 & S2 heard, RRR. No pedal edema. Gastrointestinal system: Abdomen is nondistended, soft and nontender. No organomegaly or masses felt. Normal bowel sounds heard. Central nervous system: Alert and oriented.  Right leg is weak. Other motor power is normal. Skin: No rashes, lesions or ulcers Psychiatry: Judgement and insight appear normal. Mood & affect appropriate.     Data Reviewed: I have personally reviewed following labs and imaging studies  CBC: Recent Labs  Lab 03/10/20 0619 03/10/20 1348 03/11/20 0355 03/12/20 0734  WBC 7.7  --  6.9 6.3  NEUTROABS 5.3  --   --  4.1  HGB 10.2* 9.4* 9.4* 8.9*  HCT 31.0* 28.8* 28.9* 27.5*  MCV 89.3  --  88.7 88.1  PLT 211  --  206 161   Basic Metabolic Panel: Recent Labs  Lab 03/10/20 0619 03/10/20 0844 03/11/20 0355 03/12/20 0521  NA 140  --  138 133*  K 3.6  --  3.5 3.5  CL 111  --  108 104  CO2 21*  --  20* 20*  GLUCOSE 142*  --  132* 136*  BUN 10  --  8 8  CREATININE 1.02  --  1.08 1.01  CALCIUM 7.9*  --  7.8* 7.8*  MG  --  1.5*  --  1.6*  PHOS  --   --   --  2.3*   GFR: Estimated Creatinine Clearance: 74 mL/min (by C-G formula based on SCr of 1.01 mg/dL). Liver Function Tests: Recent Labs  Lab 03/10/20 0619  03/12/20 0521  AST 46* 19  ALT 21 14  ALKPHOS 55 45  BILITOT 1.5* 1.2  PROT 5.4* 5.0*  ALBUMIN 2.7* 2.3*   No results for input(s): LIPASE, AMYLASE in the last 168 hours. No results for input(s): AMMONIA in the last 168 hours. Coagulation Profile: No results for input(s): INR, PROTIME in the last 168 hours. Cardiac Enzymes: No results for input(s): CKTOTAL, CKMB, CKMBINDEX, TROPONINI in the last 168 hours. BNP (last 3 results) No results for input(s): PROBNP in the last 8760 hours. HbA1C: No results for input(s): HGBA1C in the last 72 hours. CBG: Recent Labs  Lab 03/11/20 0744 03/11/20 1129 03/11/20 1635 03/11/20 2046 03/12/20  0748  GLUCAP 122* 117* 135* 120* 109*   Lipid Profile: No results for input(s): CHOL, HDL, LDLCALC, TRIG, CHOLHDL, LDLDIRECT in the last 72 hours. Thyroid Function Tests: No results for input(s): TSH, T4TOTAL, FREET4, T3FREE, THYROIDAB in the last 72 hours. Anemia Panel: No results for input(s): VITAMINB12, FOLATE, FERRITIN, TIBC, IRON, RETICCTPCT in the last 72 hours. Sepsis Labs: No results for input(s): PROCALCITON, LATICACIDVEN in the last 168 hours.  Recent Results (from the past 240 hour(s))  MRSA PCR Screening     Status: None   Collection Time: 03/10/20  5:41 AM   Specimen: Nasal Mucosa; Nasopharyngeal  Result Value Ref Range Status   MRSA by PCR NEGATIVE NEGATIVE Final    Comment:        The GeneXpert MRSA Assay (FDA approved for NASAL specimens only), is one component of a comprehensive MRSA colonization surveillance program. It is not intended to diagnose MRSA infection nor to guide or monitor treatment for MRSA infections. Performed at [facility], [place] [address], [place], [place] [address]   SARS Coronavirus 2 by RT PCR (hospital order, performed in [facility] hospital lab) Nasopharyngeal Nasopharyngeal Swab     Status: None   Collection Time: 03/11/20 11:15 AM   Specimen: Nasopharyngeal Swab  Result Value Ref  Range Status   SARS Coronavirus 2 NEGATIVE NEGATIVE Final    Comment: (NOTE) SARS-CoV-2 target nucleic acids are NOT DETECTED.  The SARS-CoV-2 RNA is generally detectable in upper and lower respiratory specimens during the acute phase of infection. The lowest concentration of SARS-CoV-2 viral copies this assay can detect is 250 copies / mL. A negative result does not preclude SARS-CoV-2 infection and should not be used as the sole basis for treatment or other patient management decisions.  A negative result may occur with improper specimen collection / handling, submission of specimen other than nasopharyngeal swab, presence of viral mutation(s) within the areas targeted by this assay, and inadequate number of viral copies (<250 copies / mL). A negative result must be combined with clinical observations, patient history, and epidemiological information.  Fact Sheet for Patients:   StrictlyIdeas.no  Fact Sheet for Healthcare Providers: BankingDealers.co.za  This test is not yet approved or  cleared by the [place] FDA and has been authorized for detection and/or diagnosis of SARS-CoV-2 by FDA under an Emergency Use Authorization (EUA).  This EUA will remain in effect (meaning this test can be used) for the duration of the COVID-19 declaration under Section 564(b)(1) of the Act, 21 U.S.C. section 360bbb-3(b)(1), unless the authorization is terminated or revoked sooner.  Performed at [facility], [place] [address] [place], [place] [address]          Radiology Studies: DG Knee 1-2 Views Right  Result Date: 03/10/2020 CLINICAL DATA:  RIGHT knee pain EXAM: RIGHT KNEE - 1-2 VIEW COMPARISON:  02/09/2020 FINDINGS: Advanced degenerative change in the RIGHT knee with complete loss of the joint space in the lateral compartment. Patellofemoral osteoarthritic changes as well as marked narrowing of the joint space in the medial  compartment. Lucency with central increased density in the proximal RIGHT tibia measuring 2.5 x 1.9 cm. Slight enlargement of joint effusion. No acute fracture or dislocation. IMPRESSION: 1. 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 2. 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 There is history of malignancy MRI or CT for further assessment could be considered. 3. Slight enlargement of joint effusion. Electronically Signed   By: Jewel Baize.D.  On: 03/10/2020 21:54        Scheduled Meds: . amiodarone  200 mg Oral BID  . apixaban  5 mg Oral BID  . atorvastatin  40 mg Oral QHS  . Chlorhexidine Gluconate Cloth  6 each Topical Daily  . diphenhydrAMINE  25 mg Oral Once  . furosemide  20 mg Oral Daily  . hydrochlorothiazide  25 mg Oral Daily  . insulin aspart  0-5 Units Subcutaneous QHS  . insulin aspart  0-9 Units Subcutaneous TID WC  . lisinopril  10 mg Oral Daily  . magnesium oxide  400 mg Oral BID  . metoprolol tartrate  25 mg Oral BID  . pantoprazole  40 mg Oral BID  . sodium chloride flush  3 mL Intravenous Q12H   Continuous Infusions:    LOS: 2 days    Time spent: 30 minutes    Barb Merino, MD [facility] Pager [phone]

## 2020-03-12 NOTE — NC FL2 (Signed)
[place] LEVEL OF CARE SCREENING TOOL     IDENTIFICATION  Patient Name: Corey Esparza Birthdate: 1945/10/20 Sex: male Admission Date (Current Location): 03/10/2020  [place] and [place] Number:  Kathleen Argue [id number] [place] and Address:  The . [facility], [place] [address] [place], [place] [address]      Provider Number: [id number]  Attending Physician Name and Address:  Barb Merino, MD  Relative Name and Phone Number:  Karrie Doffing [phone]    Current Level of Care: Hospital Recommended Level of Care: [place] Prior Approval Number:    Date Approved/Denied: 03/08/20 PASRR Number: [id number]  Discharge Plan: SNF    Current Diagnoses: Patient Active Problem List   Diagnosis Date Noted  . GI bleed 03/10/2020  . Essential hypertension 03/10/2020  . SIRS (systemic inflammatory response syndrome) ([place]) 03/10/2020  . Hypomagnesemia 03/10/2020  . History of CVA (cerebrovascular accident) 03/10/2020  . HLD (hyperlipidemia) 03/10/2020  . Tobacco abuse 03/10/2020  . AF (paroxysmal atrial fibrillation) (HCC)     Orientation RESPIRATION BLADDER Height & Weight     Self, Time, Situation, Place  O2 (Person 2L) Incontinent Weight: 200 lb (90.7 kg) Height:  5\' 11"  (180.3 cm)  BEHAVIORAL SYMPTOMS/MOOD NEUROLOGICAL BOWEL NUTRITION STATUS      Continent Diet (carb modified, heart healthy)  AMBULATORY STATUS COMMUNICATION OF NEEDS Skin   Limited Assist Verbally Normal                       Personal Care Assistance Level of Assistance  Bathing, Feeding, Dressing Bathing Assistance: Limited assistance Feeding assistance: Independent Dressing Assistance: Limited assistance     Functional Limitations Info  Sight, Hearing, Speech Sight Info: Adequate Hearing Info: Adequate Speech Info: Adequate    SPECIAL CARE FACTORS FREQUENCY  PT (By licensed PT), OT (By licensed OT)     PT Frequency: PT at SNF to eval and treat a  min of 5x/week OT Frequency: OT at SNF to eval and treat a min of 5x/week            Contractures Contractures Info: Not present    Additional Factors Info  Code Status, Allergies, Insulin Sliding Scale Code Status Info: Full Allergies Info: No known drug allergies   Insulin Sliding Scale Info: 0-9 units 3x/day with meals; 0-5 units daily at bed       Current Medications (03/12/2020):  This is the current hospital active medication list Current Facility-Administered Medications  Medication Dose Route Frequency Provider Last Rate Last Admin  . acetaminophen (TYLENOL) tablet 650 mg  650 mg Oral Q6H PRN Fuller Plan A, MD   650 mg at 03/11/20 1628   Or  . acetaminophen (TYLENOL) suppository 650 mg  650 mg Rectal Q6H PRN Tamala Julian, Rondell A, MD      . albuterol (PROVENTIL) (2.5 MG/3ML) 0.083% nebulizer solution 2.5 mg  2.5 mg Inhalation Q4H PRN Barb Merino, MD      . amiodarone (PACERONE) tablet 200 mg  200 mg Oral BID Thompson Grayer, MD   200 mg at 03/12/20 0934  . atorvastatin (LIPITOR) tablet 40 mg  40 mg Oral QHS Barb Merino, MD      . Chlorhexidine Gluconate Cloth 2 % PADS 6 each  6 each Topical Daily Norval Morton, MD   6 each at 03/12/20 0934  . diphenhydrAMINE (BENADRYL) capsule 25 mg  25 mg Oral Once Blount, Scarlette Shorts T, NP      . furosemide (LASIX) tablet 20  mg  20 mg Oral Daily Barb Merino, MD   20 mg at 03/12/20 0934  . hydrochlorothiazide (HYDRODIURIL) tablet 25 mg  25 mg Oral Daily Barb Merino, MD   25 mg at 03/12/20 0934  . insulin aspart (novoLOG) injection 0-5 Units  0-5 Units Subcutaneous QHS Smith, Rondell A, MD      . insulin aspart (novoLOG) injection 0-9 Units  0-9 Units Subcutaneous TID WC Norval Morton, MD   1 Units at 03/11/20 0837  . lisinopril (ZESTRIL) tablet 10 mg  10 mg Oral Daily Barb Merino, MD   10 mg at 03/12/20 0934  . magnesium oxide (MAG-OX) tablet 400 mg  400 mg Oral BID Barb Merino, MD   400 mg at 03/12/20 0934  . metoprolol  tartrate (LOPRESSOR) injection 5 mg  5 mg Intravenous Q5 min PRN Lovey Newcomer T, NP   5 mg at 03/11/20 2318  . metoprolol tartrate (LOPRESSOR) tablet 25 mg  25 mg Oral BID Barb Merino, MD   25 mg at 03/12/20 0934  . morphine 2 MG/ML injection 2 mg  2 mg Intravenous Q3H PRN Fuller Plan A, MD   2 mg at 03/12/20 0221  . pantoprazole (PROTONIX) 80 mg in sodium chloride 0.9 % 100 mL (0.8 mg/mL) infusion  8 mg/hr Intravenous Continuous Smith, Rondell A, MD 10 mL/hr at 03/11/20 1300 8 mg/hr at 03/11/20 1300  . [START ON 03/13/2020] pantoprazole (PROTONIX) injection 40 mg  40 mg Intravenous Q12H Smith, Rondell A, MD      . sodium chloride flush (NS) 0.9 % injection 3 mL  3 mL Intravenous Q12H Smith, Rondell A, MD   3 mL at 03/12/20 0941     Discharge Medications: Please see discharge summary for a list of discharge medications.  Relevant Imaging Results:  Relevant Lab Results:   Additional Information SSN [id number]  Geralynn Ochs, LCSW

## 2020-03-12 NOTE — Progress Notes (Signed)
Pt requested to be back into bed for comfort. Patient    Unable to get into hoyer lift due to it     Not fitting around him. Patient moved from chair to bed with assistance of other staff.

## 2020-03-12 NOTE — Evaluation (Signed)
Occupational Therapy Evaluation Patient Details Name: Corey Esparza MRN: [id number] DOB: 1945/12/11 Today's Date: 03/12/2020    History of Present Illness 74 y.o. male with medical history significant of PAF on Eliquis, CVA, HTN, HLD, and DM2 presented to person [facility] on 6/7 with complaints of weakness and inability to care for himself at home. 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 Transferred to [facility] 03/10/20 due to afib with RVR, rising troponins, and ?need for cardiac intervention. Converted to NSR with amiodorone. GI consult due to melena, anemia.    Clinical Impression   Prior to admission, Pt reports that he did his own ADL and used DME - but also reports frequent falls.  Pt today is set up to min A for grooming, UB ADL, max to total A for LB ADL due to pain. L arm is weaker than the R due to previous CVA (recent).  He was mod A +2 to come EOB demonstrating increased ability from yesterday's session with PT. We attempted to stand for toilet transfer from elevated bed with RW, and Pt was only able to just clear their bottom with max A +2. Pt transferred to chair via lateral scoot transfer/squat pivot transfer to built up drop arm recliner. RN staff advised to use lift equipment for return to bed later today (lift pad in room). Pt very limited by pain, once instance of crying today. Informed of families desire for Pt to go to CIR. At this time, he does not have the activity tolerance - but we will continue to monitor him to see if this would be an option. Pt will require SNF post-acute to maximize safety and independence in ADL and functional transfers.     Follow Up Recommendations  SNF    Equipment Recommendations  Other (comment) (defer to next venue of care)    Recommendations for Other Services       Precautions / Restrictions Precautions Precautions:  Fall Precaution Comments: reports multiple falls since he went home after CVA Restrictions Weight Bearing Restrictions: No      Mobility Bed Mobility Overal bed mobility: Needs Assistance Bed Mobility: Rolling;Supine to Sit Rolling: Max assist   Supine to sit: Mod assist;+2 for physical assistance;+2 for safety/equipment     General bed mobility comments: assisted with legs EOB, trunk elevation, and use of bed pad to bring hips EOB, great improvement in effort from yesterday's session with PT. Still requires significant assist  Transfers Overall transfer level: Needs assistance Equipment used: Rolling walker (2 wheeled);2 person hand held assist Transfers: Sit to/from [employer] Sit to Stand: Max assist;+2 physical assistance;+2 safety/equipment;From elevated surface (unable to achieve upright - only approx 1 inch off the bed)   Squat pivot transfers: Max assist;+2 physical assistance;+2 safety/equipment;From elevated surface (using drop arm recliner and HEAVY use of bed pad to facilita)     General transfer comment: RN educated pt will need lift (pad in room) to return to bed    Balance Overall balance assessment: History of Falls                                         ADL either performed or assessed with clinical judgement   ADL Overall ADL's : Needs assistance/impaired Eating/Feeding: Set up   Grooming: Set up;Wash/dry face;Sitting  Upper Body Bathing: Minimal assistance;Sitting   Lower Body Bathing: Maximal assistance;Bed level   Upper Body Dressing : Minimal assistance;Sitting   Lower Body Dressing: Total assistance;Bed level   Toilet Transfer: Maximal assistance;+2 for physical assistance;+2 for safety/equipment;Squat-pivot Toilet Transfer Details (indicate cue type and reason): safer to use bed pan at bed level at this time Toileting- Clothing Manipulation and Hygiene: Maximal assistance       Functional mobility during  ADLs: Total assistance (lateral/squat pivot transfer only) General ADL Comments: pain limits mobility, decreased activity tolerance, slightly labile today     Vision         Perception     Praxis      Pertinent Vitals/Pain Pain Assessment: Faces Faces Pain Scale: Hurts worst Pain Location: bil knees; left worse Pain Descriptors / Indicators: Grimacing;Crying;Constant;Tender Pain Intervention(s): Limited activity within patient's tolerance;Monitored during session;Repositioned     Hand Dominance Right   Extremity/Trunk Assessment Upper Extremity Assessment Upper Extremity Assessment: Generalized weakness (L weaker than R)   Lower Extremity Assessment Lower Extremity Assessment: Defer to PT evaluation   Cervical / Trunk Assessment Cervical / Trunk Assessment: Other exceptions Cervical / Trunk Exceptions: overweight   Communication Communication Communication: HOH   Cognition Arousal/Alertness: Awake/alert Behavior During Therapy: WFL for tasks assessed/performed (only one occurance of crying today) Overall Cognitive Status: No family/caregiver present to determine baseline cognitive functioning                                 General Comments: pt oriented to person, place, situation, time NT;    General Comments  HR WFL today, on RA Pt's SpO2 was >93% throughout session    Exercises     Shoulder Instructions      Home Living Family/patient expects to be discharged to:: Private residence Living Arrangements: Alone Available Help at Discharge: Family;Friend(s);Available PRN/intermittently (daughter in school?) Type of Home: House Home Access: Stairs to enter Technical brewer of Steps: 1   Home Layout: Two level Alternate Level Stairs-Number of Steps: 1 (5" step up to bathroom/shower)   Bathroom Shower/Tub: Walk-in Psychologist, prison and probation services: Standard     Home Equipment: Cane - single point;Walker - 2 wheels;Shower seat - built in           Prior Functioning/Environment Level of Independence: Needs assistance  Gait / Transfers Assistance Needed: typically walks with walker ADL's / Homemaking Assistance Needed: no longer drives; friends/family   help with errands/groceries; was doing his own ADL per patient report            OT Problem List: Decreased strength;Decreased range of motion;Decreased activity tolerance;Impaired balance (sitting and/or standing);Decreased safety awareness;Obesity      OT Treatment/Interventions: Self-care/ADL training;Therapeutic exercise;Energy conservation;DME and/or AE instruction;Therapeutic activities;Patient/family education;Balance training    OT Goals(Current goals can be found in the care plan section) Acute Rehab OT Goals Patient Stated Goal: to have less pain in his knees; to stop falling OT Goal Formulation: With patient Time For Goal Achievement: 03/26/20 Potential to Achieve Goals: Good ADL Goals Pt Will Perform Grooming: with set-up;sitting Pt Will Perform Upper Body Dressing: with modified independence;sitting Pt Will Perform Lower Body Dressing: with mod assist;sit to/from stand Pt Will Transfer to Toilet: with mod assist;stand pivot transfer Pt Will Perform Toileting - Clothing Manipulation and hygiene: with mod assist;sitting/lateral leans Additional ADL Goal #1: Pt will perform bed mobility with mod A prior to engaging in ADL  OT  Frequency: Min 2X/week   Barriers to D/C:            Co-evaluation PT/OT/SLP Co-Evaluation/Treatment: Yes Reason for Co-Treatment: For patient/therapist safety;To address functional/ADL transfers PT goals addressed during session: Mobility/safety with mobility;Balance;Strengthening/ROM OT goals addressed during session: ADL's and self-care;Proper use of Adaptive equipment and DME;Strengthening/ROM      AM-PAC OT "6 Clicks" Daily Activity     Outcome Measure Help from another person eating meals?: None Help from another person  taking care of personal grooming?: A Little Help from another person toileting, which includes using toliet, bedpan, or urinal?: A Lot Help from another person bathing (including washing, rinsing, drying)?: A Lot Help from another person to put on and taking off regular upper body clothing?: A Little Help from another person to put on and taking off regular lower body clothing?: Total 6 Click Score: 15   End of Session Equipment Utilized During Treatment: Gait belt;Rolling walker Nurse Communication: Mobility status (use of lift to return to bed)  Activity Tolerance: Patient limited by pain Patient left: in chair;with call bell/phone within reach;with chair alarm set;with nursing/sitter in room  OT Visit Diagnosis: Unsteadiness on feet (R26.81);Muscle weakness (generalized) (M62.81);Repeated falls (R29.6);History of falling (Z91.81);Other symptoms and signs involving cognitive function;Pain Pain - Right/Left: Left (Bilateral) Pain - part of body: Knee                Time: 1126-1203 OT Time Calculation (min): 37 min Charges:  OT General Charges $OT Visit: 1 Visit OT Evaluation $OT Eval Moderate Complexity: [place] OTR/L Acute Rehabilitation Services Pager: [phone] Office: [place] 03/12/2020, 1:01 PM

## 2020-03-12 NOTE — Progress Notes (Signed)
Patient's hemoglobin has dropped slightly overnight, from 9.4 to 8.9, although BUN remains normal at 8, implying the absence of active upper tract bleeding so this drop in hemoglobin is most likely due to equilibration. No BM's past 24 hrs.  The patient's capsule endoscopy has been completed, and the official report will be scanned into the computer eventually.    Findings:  1.  No active bleeding or fresh blood seen in the GI tract during the 12 hours of the study  2.  Dark fluid was seen in the mid or distal small bowel which could be old digested serosanguineous fluid from previous bleeding  3.  There was some erythematous, nonerosive gastritis noted but there was no blood in the stomach.  4.  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  There were approximately 4 of these lesions, ranging in size from 2 to 5 mm.  None of them was bleeding.  They are not likely within reach of a push enteroscope.  Recommendations:  1.  Okay for aspirin therapy (in view of possible recent non-STEMI on presentation to outside hospital) if it is felt to be clinically important  2.  However, I would avoid BCs for analgesia, as the patient was using prior to admission for headaches (pt advised)  3.  PPI prophylaxis indefinitely, if the patient is going to be using any form of aspirin going forward.  Standard dose, such as omeprazole 20 mg daily or pantoprazole 40 mg daily, would be sufficient.  4.  No further GI work-up or intervention necessary at this time, other than providing PPI therapy.  There is nothing readily amenable to endoscopic treatment.  5.  Following discharge, the patient will need close monitoring of his Hemoccult status and hemoglobin level on an ongoing basis, through his local physician.  Depending on those results, he may need ongoing oral iron supplementation, periodic intravenous iron infusions, or transfusion support.  6.  We could  consider attempting double-balloon enteroscopy for ablation of his small bowel vascular lesions as a last resort, if the patient has recurring episodes of severe anemia, or ongoing transfusion requirement, despite the above-mentioned measures.  7.  Discharge today or tomorrow is clinically appropriate from the GI tract standpoint.  Since there is a minor decline in the patient's hemoglobin level today, it would be reasonable to watch him 1 more day and make sure that that is not a trend.  8.  We will sign off.  Please call us if we can be of further assistance in this patient's care.  As mentioned above, he will need follow-up monitoring arranged through his local physician.  Cleotis Nipper, M.D. Pager [phone] If no answer or after 5 PM call [phone]

## 2020-03-13 LAB — CBC WITH DIFFERENTIAL/PLATELET
Abs Immature Granulocytes: 0.03 K/uL (ref 0.00–0.07)
Basophils Absolute: 0 K/uL (ref 0.0–0.1)
Basophils Relative: 0 %
Eosinophils Absolute: 0.3 K/uL (ref 0.0–0.5)
Eosinophils Relative: 4 %
HCT: 28 % — ABNORMAL LOW (ref 39.0–52.0)
Hemoglobin: 9.2 g/dL — ABNORMAL LOW (ref 13.0–17.0)
Immature Granulocytes: 0 %
Lymphocytes Relative: 15 %
Lymphs Abs: 1 K/uL (ref 0.7–4.0)
MCH: 28.5 pg (ref 26.0–34.0)
MCHC: 32.9 g/dL (ref 30.0–36.0)
MCV: 86.7 fL (ref 80.0–100.0)
Monocytes Absolute: 1.2 K/uL — ABNORMAL HIGH (ref 0.1–1.0)
Monocytes Relative: 18 %
Neutro Abs: 4.3 K/uL (ref 1.7–7.7)
Neutrophils Relative %: 63 %
Platelets: 219 K/uL (ref 150–400)
RBC: 3.23 MIL/uL — ABNORMAL LOW (ref 4.22–5.81)
RDW: 15.9 % — ABNORMAL HIGH (ref 11.5–15.5)
WBC: 6.8 K/uL (ref 4.0–10.5)
nRBC: 0 % (ref 0.0–0.2)

## 2020-03-13 LAB — GLUCOSE, CAPILLARY
Glucose-Capillary: 119 mg/dL — ABNORMAL HIGH (ref 70–99)
Glucose-Capillary: 183 mg/dL — ABNORMAL HIGH (ref 70–99)

## 2020-03-13 MED ORDER — HYDROCODONE-ACETAMINOPHEN 5-325 MG PO TABS
1.0000 | ORAL_TABLET | ORAL | Status: DC | PRN
  Administered 2020-03-13 – 2020-03-15 (×4): 1 via ORAL
  Filled 2020-03-13 (×4): qty 1

## 2020-03-13 MED ORDER — HYDROCODONE-ACETAMINOPHEN 5-325 MG PO TABS: 1.0000 | ORAL_TABLET | ORAL | 0 refills | Status: AC | PRN

## 2020-03-13 MED ORDER — GUAIFENESIN 100 MG/5ML PO SOLN
5.0000 mL | Freq: Four times a day (QID) | ORAL | Status: DC | PRN
  Administered 2020-03-13: 100 mg via ORAL
  Filled 2020-03-13: qty 5

## 2020-03-13 MED FILL — Diltiazem HCl IV For Soln 100 MG: INTRAVENOUS | Qty: 100 | Status: AC

## 2020-03-13 NOTE — Progress Notes (Signed)
PROGRESS NOTE    Paula Zietz  TIW:[id number] DOB: 1946-05-31 DOA: 03/10/2020 PCP: Patient, No Pcp Per    Brief Narrative:  74 year old gentleman with history of paroxysmal A. 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  He was sent home on Eliquis and metoprolol.  Patient was found at home unable to take care of himself, extremely weak and was brought back to the hospital.  Repeat CT scan of the head did not show any evidence of new stroke.  Labs revealed hemoglobin 8.3 with positive Hemoccult.  Noted to be melanotic stool.  Hemoglobin dropped as low as 7.9, received total 2 units of PRBC.  6/10 overnight complained of chest pain or shortness of breath, found to have A. fib with RVR and troponin elevated to 6.6.  Mild temperature 100.8.  Patient was transferred to [facility] for cardiology and further endoscopic care.  Seen and followed by cardiology and GI.   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 EGD and colonoscopy done at person [facility], no significant source of bleeding. 6/11- capsule endoscopy done, found to have vascular ectasias.  No major bleeding.  Hemoglobin is stabilized. Challenging with Eliquis.  Hemoglobin is stable.  Paroxysmal A. fib with RVR, elevated  troponin and suspected non-STEMI: Reason for transfer to this hospital.  Currently remains asymptomatic and chest pain-free. Treated with amiodarone, converted to sinus rhythm.  Intermittent A. fib. Rate controlled on metoprolol and amiodarone.  Appreciate cardiology input.  Eliquis resumed. Out patient nuclear stress test planned.  Left MCA stroke with right hemiparesis:  Work with PT OT.  Patient will benefit with inpatient therapies.  Refer to skilled nursing facility. Resumed all his antihypertensives.  Resumed atorvastatin.  Resume Eliquis.  Hypomagnesemia: With poor appetite.  Replaced IV.  Keep on oral scheduled replacement.  Type 2 diabetes on Metformin: Holding Metformin.  Currently on sliding scale insulin.  Resume Metformin on discharge.  DVT prophylaxis: SCD Code Status: Full code Family Communication: Patient's daughter on the phone 6/14. Disposition Plan: Status is: Inpatient  Remains inpatient appropriate because: Unsafe discharge home  Dispo: The patient is from: Home              Anticipated d/c is to: SNF              Anticipated d/c date is: 1 day.              Patient currently is medically stable to transfer to a skilled level of care.  Consultants:   Gastroenterology  Cardiology  Procedures:   Capsule endoscopy,  Antimicrobials:   Antibiotics.  Discontinued   Subjective: Patient seen and examined.  Knee pain hampers the right leg movement.  No other overnight events.  Telemetry shows rate controlled sinus rhythm.  Objective: Vitals:   03/12/20 2300 03/13/20 0303 03/13/20 0800 03/13/20  1200  BP: 119/64 128/67    Pulse: 86 88    Resp: 20 (!) 23    Temp: 99 F (37.2 C) 98.4 F (36.9 C) 98.5 F (36.9 C) 98.2 F (36.8 C)  TempSrc: Oral Oral Oral Oral  SpO2: 95% 94%    Weight:  90.9 kg    Height:        Intake/Output Summary (Last 24 hours) at 03/13/2020 1325 Last data filed at 03/13/2020 0900 Gross per 24 hour  Intake 240 ml  Output 500  ml  Net -260 ml   Filed Weights   03/10/20 0533 03/10/20 1059 03/13/20 0303  Weight: 90.2 kg 90.7 kg 90.9 kg    Examination:  General exam: Appears calm and comfortable, chronically sick looking gentleman on room air. Respiratory system: No added sounds. Cardiovascular system: S1 & S2 heard, RRR. No pedal edema.  Bilateral knee deformities. Gastrointestinal system: Abdomen is nondistended, soft and nontender. No organomegaly or masses felt. Normal bowel sounds heard. Central nervous system: Alert and oriented.  Right leg is weak.  Less mobility due to knee pain.  Other motor power is normal. Skin: No rashes, lesions or ulcers Psychiatry: Judgement and insight appear normal. Mood & affect appropriate.     Data Reviewed: I have personally reviewed following labs and imaging studies  CBC: Recent Labs  Lab 03/10/20 0619 03/10/20 1348 03/11/20 0355 03/12/20 0734 03/13/20 0300  WBC 7.7  --  6.9 6.3 6.8  NEUTROABS 5.3  --   --  4.1 4.3  HGB 10.2* 9.4* 9.4* 8.9* 9.2*  HCT 31.0* 28.8* 28.9* 27.5* 28.0*  MCV 89.3  --  88.7 88.1 86.7  PLT 211  --  206 197 956   Basic Metabolic Panel: Recent Labs  Lab 03/10/20 0619 03/10/20 0844 03/11/20 0355 03/12/20 0521  NA 140  --  138 133*  K 3.6  --  3.5 3.5  CL 111  --  108 104  CO2 21*  --  20* 20*  GLUCOSE 142*  --  132* 136*  BUN 10  --  8 8  CREATININE 1.02  --  1.08 1.01  CALCIUM 7.9*  --  7.8* 7.8*  MG  --  1.5*  --  1.6*  PHOS  --   --   --  2.3*   GFR: Estimated Creatinine Clearance: 74 mL/min (by C-G formula based on SCr of 1.01 mg/dL). Liver Function Tests: Recent Labs  Lab 03/10/20 0619 03/12/20 0521  AST 46* 19  ALT 21 14  ALKPHOS 55 45  BILITOT 1.5* 1.2  PROT 5.4* 5.0*  ALBUMIN 2.7* 2.3*   No results for input(s): LIPASE, AMYLASE in the last 168 hours. No results for input(s): AMMONIA in the last 168 hours. Coagulation Profile: No results for input(s): INR, PROTIME in the last 168 hours. Cardiac  Enzymes: No results for input(s): CKTOTAL, CKMB, CKMBINDEX, TROPONINI in the last 168 hours. BNP (last 3 results) No results for input(s): PROBNP in the last 8760 hours. HbA1C: No results for input(s): HGBA1C in the last 72 hours. CBG: Recent Labs  Lab 03/11/20 0744 03/11/20 1129 03/11/20 1635 03/11/20 2046 03/12/20 0748  GLUCAP 122* 117* 135* 120* 109*   Lipid Profile: No results for input(s): CHOL, HDL, LDLCALC, TRIG, CHOLHDL, LDLDIRECT in the last 72 hours. Thyroid Function Tests: No results for input(s): TSH, T4TOTAL, FREET4, T3FREE, THYROIDAB in the last 72 hours. Anemia Panel: No results for input(s): VITAMINB12, FOLATE, FERRITIN, TIBC, IRON, RETICCTPCT in the last 72  hours. Sepsis Labs: No results for input(s): PROCALCITON, LATICACIDVEN in the last 168 hours.  Recent Results (from the past 240 hour(s))  MRSA PCR Screening     Status: None   Collection Time: 03/10/20  5:41 AM   Specimen: Nasal Mucosa; Nasopharyngeal  Result Value Ref Range Status   MRSA by PCR NEGATIVE NEGATIVE Final    Comment:        The GeneXpert MRSA Assay (FDA approved for NASAL specimens only), is one component of a comprehensive MRSA colonization surveillance program. It is not intended to diagnose MRSA infection nor to guide or monitor treatment for MRSA infections. Performed at [facility], [place] [address], [place], [place] [address]   SARS Coronavirus 2 by RT PCR (hospital order, performed in [facility] hospital lab) Nasopharyngeal Nasopharyngeal Swab     Status: None   Collection Time: 03/11/20 11:15 AM   Specimen: Nasopharyngeal Swab  Result Value Ref Range Status   SARS Coronavirus 2 NEGATIVE NEGATIVE Final    Comment: (NOTE) SARS-CoV-2 target nucleic acids are NOT DETECTED.  The SARS-CoV-2 RNA is generally detectable in upper and lower respiratory specimens during the acute phase of infection. The lowest concentration of SARS-CoV-2 viral copies this assay can detect is  250 copies / mL. A negative result does not preclude SARS-CoV-2 infection and should not be used as the sole basis for treatment or other patient management decisions.  A negative result may occur with improper specimen collection / handling, submission of specimen other than nasopharyngeal swab, presence of viral mutation(s) within the areas targeted by this assay, and inadequate number of viral copies (<250 copies / mL). A negative result must be combined with clinical observations, patient history, and epidemiological information.  Fact Sheet for Patients:   StrictlyIdeas.no  Fact Sheet for Healthcare Providers: BankingDealers.co.za  This test is not yet approved or  cleared by the [place] FDA and has been authorized for detection and/or diagnosis of SARS-CoV-2 by FDA under an Emergency Use Authorization (EUA).  This EUA will remain in effect (meaning this test can be used) for the duration of the COVID-19 declaration under Section 564(b)(1) of the Act, 21 U.S.C. section 360bbb-3(b)(1), unless the authorization is terminated or revoked sooner.  Performed at [facility], [place] [address] [place], [place] [address]          Radiology Studies: No results found.      Scheduled Meds: . amiodarone  200 mg Oral BID  . apixaban  5 mg Oral BID  . atorvastatin  40 mg Oral QHS  . Chlorhexidine Gluconate Cloth  6 each Topical Daily  . diphenhydrAMINE  25 mg Oral Once  . furosemide  20 mg Oral Daily  . hydrochlorothiazide  25 mg Oral Daily  . insulin aspart  0-5 Units Subcutaneous QHS  . insulin aspart  0-9 Units Subcutaneous TID WC  . lisinopril  10 mg Oral Daily  . magnesium oxide  400 mg Oral BID  . metoprolol tartrate  25 mg Oral BID  . pantoprazole  40 mg Oral BID  . potassium & sodium phosphates  1 packet Oral TID WC & HS  . sodium chloride flush  3 mL Intravenous Q12H   Continuous Infusions:    LOS: 3  days    Time spent: 30 minutes    Barb Merino, MD [facility] Pager [phone]

## 2020-03-13 NOTE — Progress Notes (Signed)
Caridologist in room with patient

## 2020-03-13 NOTE — Progress Notes (Addendum)
Progress Note  Patient Name: Corey Esparza Date of Encounter: 03/13/2020  [facility] Cardiologist: Evalina Field, MD   Subjective   Denies any recent chest pain or shortness of breath  Inpatient Medications    Scheduled Meds: . amiodarone  200 mg Oral BID  . apixaban  5 mg Oral BID  . atorvastatin  40 mg Oral QHS  . Chlorhexidine Gluconate Cloth  6 each Topical Daily  . diphenhydrAMINE  25 mg Oral Once  . furosemide  20 mg Oral Daily  . hydrochlorothiazide  25 mg Oral Daily  . insulin aspart  0-5 Units Subcutaneous QHS  . insulin aspart  0-9 Units Subcutaneous TID WC  . lisinopril  10 mg Oral Daily  . magnesium oxide  400 mg Oral BID  . metoprolol tartrate  25 mg Oral BID  . pantoprazole  40 mg Oral BID  . potassium & sodium phosphates  1 packet Oral TID WC & HS  . sodium chloride flush  3 mL Intravenous Q12H   Continuous Infusions:  PRN Meds: acetaminophen **OR** acetaminophen, albuterol, HYDROcodone-acetaminophen, metoprolol tartrate   Vital Signs    Vitals:   03/12/20 2236 03/12/20 2300 03/13/20 0303 03/13/20 0800  BP: (!) 127/59 119/64 128/67   Pulse: 86 86 88   Resp:  20 (!) 23   Temp:  99 F (37.2 C) 98.4 F (36.9 C) 98.5 F (36.9 C)  TempSrc:  Oral Oral Oral  SpO2:  95% 94%   Weight:   90.9 kg   Height:        Intake/Output Summary (Last 24 hours) at 03/13/2020 1118 Last data filed at 03/13/2020 0301 Gross per 24 hour  Intake 120 ml  Output 500 ml  Net -380 ml   Last 3 Weights 03/13/2020 03/10/2020 03/10/2020  Weight (lbs) 200 lb 6.4 oz 200 lb 198 lb 13.7 oz  Weight (kg) 90.9 kg 90.719 kg 90.2 kg      Telemetry    NSR without significant afib - Personally Reviewed  ECG    NSR with PACs and PVCs - Personally Reviewed  Physical Exam   GEN: No acute distress.   Neck: No JVD Cardiac: RRR, no murmurs, rubs, or gallops.  Respiratory: Clear to auscultation bilaterally. GI: Soft, nontender, non-distended  MS: No edema; No  deformity. Neuro:  Nonfocal  Psych: Normal affect   Labs    High Sensitivity Troponin:   Recent Labs  Lab 03/10/20 0619 03/10/20 0844  TROPONINIHS 4,585* 4,405*      Chemistry Recent Labs  Lab 03/10/20 0619 03/11/20 0355 03/12/20 0521  NA 140 138 133*  K 3.6 3.5 3.5  CL 111 108 104  CO2 21* 20* 20*  GLUCOSE 142* 132* 136*  BUN 10 8 8   CREATININE 1.02 1.08 1.01  CALCIUM 7.9* 7.8* 7.8*  PROT 5.4*  --  5.0*  ALBUMIN 2.7*  --  2.3*  AST 46*  --  19  ALT 21  --  14  ALKPHOS 55  --  45  BILITOT 1.5*  --  1.2  GFRNONAA >60 >60 >60  GFRAA >60 >60 >60  ANIONGAP 8 10 9      Hematology Recent Labs  Lab 03/11/20 0355 03/12/20 0734 03/13/20 0300  WBC 6.9 6.3 6.8  RBC 3.26* 3.12* 3.23*  HGB 9.4* 8.9* 9.2*  HCT 28.9* 27.5* 28.0*  MCV 88.7 88.1 86.7  MCH 28.8 28.5 28.5  MCHC 32.5 32.4 32.9  RDW 16.9* 16.3* 15.9*  PLT 206 197  219    BNPNo results for input(s): BNP, PROBNP in the last 168 hours.   DDimer No results for input(s): DDIMER in the last 168 hours.   Radiology    No results found.  Cardiac Studies   Echo 03/10/2020 1. Left ventricular ejection fraction, by estimation, is 40 to 45%. The  left ventricle has mildly decreased function. The left ventricle has no  regional wall motion abnormalities. There is moderate left ventricular  hypertrophy. Left ventricular  diastolic parameters are consistent with Grade I diastolic dysfunction  (impaired relaxation).  2. Right ventricular systolic function is normal. The right ventricular  size is normal.  3. The mitral valve is normal in structure. No evidence of mitral valve  regurgitation. No evidence of mitral stenosis.  4. The aortic valve is tricuspid. Aortic valve regurgitation is not  visualized. Mild to moderate aortic valve sclerosis/calcification is  present, without any evidence of aortic stenosis.  5. The inferior vena cava is dilated in size with <50% respiratory  variability, suggesting right  atrial pressure of 15 mmHg.   Patient Profile     74 y.o. 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 He was taken off of Eliquis due to concern of GI bleed recently.   Assessment & Plan    1. Elevated troponin: in the setting of acute blood loss anemia  - Echo 03/10/2020 shows EF 40-45%, moderate LVH, grade 1 DD  - given recent GI bleed, unlikely to be a candidate to add antiplatelet, may consider outpatient myoview once recovered from anemia. Denies any angina symptom, drop in EF maybe related to PAF  - need 2-4 weeks outpatient cardiology followup  2. PAF  - CHA2DS2-Vasc score 5 (age, HTN, DM and CVA)  - converted on amiodarone.  - finished capsule endoscopy, Eliquis resumed yesterday.   - continue amiodarone PO 200mg  BID for 1 month, then reduce to 200mg  daily thereafter. Need TSH and LFT on followup to make sure no amiodarone toxicity.   3. HTN  4. HLD: on lipitor  5. DM II  6. Recurrent CVA: most recent CVA in May 2021  7. Severe anemia: transfused with 2 units of PRBC    No further recommendation from cardiology service.   For questions or updates, please contact [place] Please consult [email or web address] for contact info under        Signed, Almyra Deforest, [place]  03/13/2020, 11:18 AM    Personally seen and examined. Agree with above.   Will consider NUC stress as outpatient AMIO 200 BID gentle load for one month then 200 Qd  EF 40%  PAF Eliquis is back on . Watch for bleeding.   Signing off.  Candee Furbish, MD

## 2020-03-13 NOTE — Plan of Care (Signed)

## 2020-03-13 NOTE — Progress Notes (Signed)
Patient is resting comfortably in no acute distress. Report from previous RN that she administered some morphine to patient for knee pain during the night.  Tech is in room at this time checking patients blood glucose

## 2020-03-13 NOTE — TOC Progression Note (Addendum)
Transition of Care [facility]) - Progression Note    Patient Details  Name: Massey Ruhland MRN: [id number] Date of Birth: 1945-11-03  Transition of Care [facility]) CM/SW [place], [place] Phone Number: 03/13/2020, 1:45 PM  Clinical Narrative:    3:04pm- CSW spoke with pt daughter Janett Billow via telephone, introduced self, role, reason for call. Confirmed referrals made, she is also aware of transportation limitations- she plans on taking him in her car when ready if [facility] can take him. TOC team continuing to follow.  1:45pm- Referrals sent to pt preferred SNFs: [facility] in [place], [place] [phone]) and [facility] [phone]). Will f/u for determination. [facility] [place] miles from [place] meaning pt will need to be transported in private vehicle or under private pay ambulance Pt will also need insurance approval prior to d/c.    Expected Discharge Plan: Skilled Nursing Facility Barriers to Discharge: Ship broker, Continued Medical Work up  Expected Discharge Plan and Services Expected Discharge Plan: [place] In-house Referral: Clinical Social Work Discharge Planning Services: CM Consult   Living arrangements for the past 2 months: Single Family Home  Readmission Risk Interventions No flowsheet data found.

## 2020-03-14 LAB — CBC WITH DIFFERENTIAL/PLATELET
Abs Immature Granulocytes: 0.03 10*3/uL (ref 0.00–0.07)
Basophils Absolute: 0 10*3/uL (ref 0.0–0.1)
Basophils Relative: 0 %
Eosinophils Absolute: 0.1 10*3/uL (ref 0.0–0.5)
Eosinophils Relative: 3 %
HCT: 25.2 % — ABNORMAL LOW (ref 39.0–52.0)
Hemoglobin: 8.4 g/dL — ABNORMAL LOW (ref 13.0–17.0)
Immature Granulocytes: 1 %
Lymphocytes Relative: 19 %
Lymphs Abs: 0.8 10*3/uL (ref 0.7–4.0)
MCH: 28.7 pg (ref 26.0–34.0)
MCHC: 33.3 g/dL (ref 30.0–36.0)
MCV: 86 fL (ref 80.0–100.0)
Monocytes Absolute: 0.6 10*3/uL (ref 0.1–1.0)
Monocytes Relative: 16 %
Neutro Abs: 2.4 10*3/uL (ref 1.7–7.7)
Neutrophils Relative %: 61 %
Platelets: 212 10*3/uL (ref 150–400)
RBC: 2.93 MIL/uL — ABNORMAL LOW (ref 4.22–5.81)
RDW: 15.5 % (ref 11.5–15.5)
WBC: 3.9 10*3/uL — ABNORMAL LOW (ref 4.0–10.5)
nRBC: 0 % (ref 0.0–0.2)

## 2020-03-14 LAB — GLUCOSE, CAPILLARY
Glucose-Capillary: 119 mg/dL — ABNORMAL HIGH (ref 70–99)
Glucose-Capillary: 128 mg/dL — ABNORMAL HIGH (ref 70–99)
Glucose-Capillary: 139 mg/dL — ABNORMAL HIGH (ref 70–99)

## 2020-03-14 LAB — SARS CORONAVIRUS 2 (TAT 6-24 HRS): SARS Coronavirus 2: NEGATIVE

## 2020-03-14 NOTE — Progress Notes (Signed)
PROGRESS NOTE    Corey Esparza  VEH:[id number] DOB: 05-20-1946 DOA: 03/10/2020 PCP: Patient, No Pcp Per    Brief Narrative:  73 year old gentleman with history of paroxysmal A. 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  Patient was recently hospitalized on 5/10-5/14 to person [facility] where MRI showed acute infarction in the posterior limb of the left internal capsule along with multiple chronic CVA.  He was sent home on Eliquis and metoprolol.  Patient was found at home unable to take care of himself, extremely weak and was brought back to the hospital.  Repeat CT scan of the head did not show any evidence of new stroke.  Labs revealed hemoglobin 8.3 with positive Hemoccult.  Noted to be melanotic stool.  Hemoglobin dropped as low as 7.9, received total 2 units of PRBC.  6/10 overnight complained of chest pain or shortness of breath, found to have A. fib with RVR and troponin elevated to 6.6.  Mild temperature 100.8.  Patient was transferred to [facility] for cardiology and further endoscopic care.  Seen and followed by cardiology and GI.   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 EGD and colonoscopy done at person [facility], no significant source of bleeding. 6/11- capsule endoscopy done, found to have vascular ectasias.  No major bleeding.  Hemoglobin is stabilized. Challenging with Eliquis.  Hemoglobin is stable.  Will need very close  monitoring.  Paroxysmal A. fib with RVR, elevated troponin and suspected non-STEMI: Reason for transfer to this hospital.  Currently remains asymptomatic and chest pain-free. Treated with amiodarone, converted to sinus rhythm.   Rate controlled on metoprolol and amiodarone.  Appreciate cardiology input.  Eliquis resumed. Out patient nuclear stress test planned.  Left MCA stroke with right hemiparesis:  Work with PT OT.  Patient will benefit with inpatient therapies.  Refer to skilled nursing facility. Resumed all his antihypertensives.  Resumed atorvastatin.  Resume Eliquis.  Hypomagnesemia: With poor appetite.  Replaced IV.  Keep on oral scheduled replacement.  Type 2 diabetes on Metformin: Holding Metformin.  Currently on sliding scale insulin.  Resume Metformin on discharge.  DVT prophylaxis: SCD Code Status: Full code Family Communication: Patient's daughter on the phone 6/14. Disposition Plan: Status is: Inpatient  Remains inpatient appropriate because: Unsafe discharge home  Dispo: The patient is from: Home              Anticipated d/c is to: SNF              Anticipated d/c date is: When bed available.              Patient currently is medically stable to transfer to a skilled level of care.  Consultants:   Gastroenterology  Cardiology  Procedures:   Capsule endoscopy,  Antimicrobials:   Antibiotics.  Discontinued   Subjective: Seen and examined.  No overnight events.  Difficulty moving right leg because of the pain in the right knee.  Objective: Vitals:   03/13/20 2144 03/14/20 0215 03/14/20 0400  03/14/20 0800  BP:  (!) 114/52 (!) 93/52 (!) 130/57  Pulse:  66 61 64  Resp:  15 16 18   Temp:   98.4 F (36.9 C) 97.9 F (36.6 C)  TempSrc:   Axillary Oral  SpO2: 97% 95% 96% 94%  Weight:      Height:        Intake/Output Summary (Last 24 hours) at 03/14/2020 1405 Last data filed at 03/14/2020 0438 Gross per 24 hour  Intake 480 ml  Output 1000 ml  Net  -520 ml   Filed Weights   03/10/20 0533 03/10/20 1059 03/13/20 0303  Weight: 90.2 kg 90.7 kg 90.9 kg    Examination:  Physical Exam Constitutional:      Comments: Not in any distress.  Chronically sick looking.  On 2 L oxygen?  HENT:     Head: Normocephalic.  Eyes:     Pupils: Pupils are equal, round, and reactive to light.  Cardiovascular:     Rate and Rhythm: Normal rate and regular rhythm.  Musculoskeletal:     Comments: Bilateral knee with stiffness.  Neurological:     Mental Status: He is alert.     Comments: Alert and oriented x3. Right lower extremity is weak and limited due to knee pain.       Data Reviewed: I have personally reviewed following labs and imaging studies  CBC: Recent Labs  Lab 03/10/20 0619 03/10/20 0619 03/10/20 1348 03/11/20 0355 03/12/20 0734 03/13/20 0300 03/14/20 0342  WBC 7.7  --   --  6.9 6.3 6.8 3.9*  NEUTROABS 5.3  --   --   --  4.1 4.3 2.4  HGB 10.2*   < > 9.4* 9.4* 8.9* 9.2* 8.4*  HCT 31.0*   < > 28.8* 28.9* 27.5* 28.0* 25.2*  MCV 89.3  --   --  88.7 88.1 86.7 86.0  PLT 211  --   --  206 197 219 212   < > = values in this interval not displayed.   Basic Metabolic Panel: Recent Labs  Lab 03/10/20 0619 03/10/20 0844 03/11/20 0355 03/12/20 0521  NA 140  --  138 133*  K 3.6  --  3.5 3.5  CL 111  --  108 104  CO2 21*  --  20* 20*  GLUCOSE 142*  --  132* 136*  BUN 10  --  8 8  CREATININE 1.02  --  1.08 1.01  CALCIUM 7.9*  --  7.8* 7.8*  MG  --  1.5*  --  1.6*  PHOS  --   --   --  2.3*   GFR: Estimated Creatinine Clearance: 74 mL/min (by C-G formula based on SCr of 1.01 mg/dL). Liver Function Tests: Recent Labs  Lab 03/10/20 0619 03/12/20 0521  AST 46* 19  ALT 21 14  ALKPHOS 55 45  BILITOT 1.5* 1.2  PROT 5.4* 5.0*  ALBUMIN 2.7* 2.3*   No results for input(s): LIPASE, AMYLASE in the last 168 hours. No results for input(s): AMMONIA in the last 168 hours. Coagulation Profile: No results for input(s): INR,  PROTIME in the last 168 hours. Cardiac Enzymes: No results for input(s): CKTOTAL, CKMB, CKMBINDEX, TROPONINI in the last 168 hours. BNP (last 3 results) No results for input(s): PROBNP in the last 8760 hours. HbA1C: No results for input(s): HGBA1C in the last 72 hours. CBG: Recent Labs  Lab 03/12/20 0748 03/13/20 1555 03/13/20 2043 03/14/20 0827 03/14/20 1157  GLUCAP 109* 119* 183* 119* 128*  Lipid Profile: No results for input(s): CHOL, HDL, LDLCALC, TRIG, CHOLHDL, LDLDIRECT in the last 72 hours. Thyroid Function Tests: No results for input(s): TSH, T4TOTAL, FREET4, T3FREE, THYROIDAB in the last 72 hours. Anemia Panel: No results for input(s): VITAMINB12, FOLATE, FERRITIN, TIBC, IRON, RETICCTPCT in the last 72 hours. Sepsis Labs: No results for input(s): PROCALCITON, LATICACIDVEN in the last 168 hours.  Recent Results (from the past 240 hour(s))  MRSA PCR Screening     Status: None   Collection Time: 03/10/20  5:41 AM   Specimen: Nasal Mucosa; Nasopharyngeal  Result Value Ref Range Status   MRSA by PCR NEGATIVE NEGATIVE Final    Comment:        The GeneXpert MRSA Assay (FDA approved for NASAL specimens only), is one component of a comprehensive MRSA colonization surveillance program. It is not intended to diagnose MRSA infection nor to guide or monitor treatment for MRSA infections. Performed at [facility], [place] [address] [place], [place] [address]   SARS Coronavirus 2 by RT PCR (hospital order, performed in [facility] hospital lab) Nasopharyngeal Nasopharyngeal Swab     Status: None   Collection Time: 03/11/20 11:15 AM   Specimen: Nasopharyngeal Swab  Result Value Ref Range Status   SARS Coronavirus 2 NEGATIVE NEGATIVE Final    Comment: (NOTE) SARS-CoV-2 target nucleic acids are NOT DETECTED.  The SARS-CoV-2 RNA is generally detectable in upper and lower respiratory specimens during the acute phase of infection. The lowest concentration of SARS-CoV-2  viral copies this assay can detect is 250 copies / mL. A negative result does not preclude SARS-CoV-2 infection and should not be used as the sole basis for treatment or other patient management decisions.  A negative result may occur with improper specimen collection / handling, submission of specimen other than nasopharyngeal swab, presence of viral mutation(s) within the areas targeted by this assay, and inadequate number of viral copies (<250 copies / mL). A negative result must be combined with clinical observations, patient history, and epidemiological information.  Fact Sheet for Patients:   StrictlyIdeas.no  Fact Sheet for Healthcare Providers: BankingDealers.co.za  This test is not yet approved or  cleared by the [place] FDA and has been authorized for detection and/or diagnosis of SARS-CoV-2 by FDA under an Emergency Use Authorization (EUA).  This EUA will remain in effect (meaning this test can be used) for the duration of the COVID-19 declaration under Section 564(b)(1) of the Act, 21 U.S.C. section 360bbb-3(b)(1), unless the authorization is terminated or revoked sooner.  Performed at [facility], [place] [address], [place], [place] [address]          Radiology Studies: No results found.      Scheduled Meds: . amiodarone  200 mg Oral BID  . apixaban  5 mg Oral BID  . atorvastatin  40 mg Oral QHS  . Chlorhexidine Gluconate Cloth  6 each Topical Daily  . diphenhydrAMINE  25 mg Oral Once  . furosemide  20 mg Oral Daily  . hydrochlorothiazide  25 mg Oral Daily  . insulin aspart  0-5 Units Subcutaneous QHS  . insulin aspart  0-9 Units Subcutaneous TID WC  . lisinopril  10 mg Oral Daily  . magnesium oxide  400 mg Oral BID  . metoprolol tartrate  25 mg Oral BID  . pantoprazole  40 mg Oral BID  . potassium & sodium phosphates  1 packet Oral TID WC & HS  . sodium chloride flush  3  mL Intravenous Q12H    Continuous Infusions:    LOS: 4 days    Time spent: 30 minutes    Barb Merino, MD [facility] Pager [phone]

## 2020-03-14 NOTE — TOC Progression Note (Signed)
Transition of Care [facility]) - Progression Note    Patient Details  Name: Corey Esparza MRN: [id number] Date of Birth: 1946-01-22  Transition of Care [facility]) CM/SW  [place], [place] Phone Number: 03/14/2020, 3:55 PM  Clinical Narrative:    CSW received confirmation of bed at [facility] [phone]). Pt insurance authorization is pending. New COVID requested. Pt daughter plans on transporting pt to SNF when insurance auth received. Admissions director also aware of this plan, has pt daughter number to contact her about further questions.   TOC team awaits determination from [facility] Medicare.    Expected Discharge Plan: Skilled Nursing Facility Barriers to Discharge: Ship broker, Continued Medical Work up  Expected Discharge Plan and Services Expected Discharge Plan: [place] In-house Referral: Clinical Social Work Discharge Planning Services: CM Consult Living arrangements for the past 2 months: Single Family Home  Readmission Risk Interventions Readmission Risk Prevention Plan 03/14/2020  Post Dischage Appt Not Complete  Appt Comments plan for SNF  Medication Screening Complete  Transportation Screening Complete

## 2020-03-15 LAB — CBC WITH DIFFERENTIAL/PLATELET
Abs Immature Granulocytes: 0.03 10*3/uL (ref 0.00–0.07)
Basophils Absolute: 0 10*3/uL (ref 0.0–0.1)
Basophils Relative: 1 %
Eosinophils Absolute: 0.2 10*3/uL (ref 0.0–0.5)
Eosinophils Relative: 4 %
HCT: 27.3 % — ABNORMAL LOW (ref 39.0–52.0)
Hemoglobin: 9 g/dL — ABNORMAL LOW (ref 13.0–17.0)
Immature Granulocytes: 1 %
Lymphocytes Relative: 21 %
Lymphs Abs: 1 10*3/uL (ref 0.7–4.0)
MCH: 28.3 pg (ref 26.0–34.0)
MCHC: 33 g/dL (ref 30.0–36.0)
MCV: 85.8 fL (ref 80.0–100.0)
Monocytes Absolute: 0.8 10*3/uL (ref 0.1–1.0)
Monocytes Relative: 17 %
Neutro Abs: 2.7 10*3/uL (ref 1.7–7.7)
Neutrophils Relative %: 56 %
Platelets: 233 10*3/uL (ref 150–400)
RBC: 3.18 MIL/uL — ABNORMAL LOW (ref 4.22–5.81)
RDW: 15.2 % (ref 11.5–15.5)
WBC: 4.7 10*3/uL (ref 4.0–10.5)
nRBC: 0 % (ref 0.0–0.2)

## 2020-03-15 MED ORDER — AMIODARONE HCL 200 MG PO TABS: 200.0000 mg | ORAL_TABLET | Freq: Two times a day (BID) | ORAL | 0 refills | Status: AC

## 2020-03-15 MED ORDER — PANTOPRAZOLE SODIUM 40 MG PO TBEC: 40.0000 mg | DELAYED_RELEASE_TABLET | Freq: Two times a day (BID) | ORAL | Status: AC

## 2020-03-15 NOTE — Progress Notes (Signed)
Physical Therapy Treatment Patient Details Name: Corey Esparza MRN: [id number] DOB: 15-Sep-1946 Today's Date: 03/15/2020    History of Present Illness 74 y.o. male with medical history significant of PAF on Eliquis, CVA, HTN, HLD, and DM2 presented to person [facility] on 6/7 with complaints of weakness and inability to care for himself at home. 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 Transferred to [facility] 03/10/20 due to afib with RVR, rising troponins, and ?need for cardiac intervention. Converted to NSR with amiodorone. GI consult due to melena, anemia.     PT Comments    Pt showing progressing toward goals today.  Pt incontinent of urine and stool.  Emphasis on transitions to EOB, sit to stand, multiple transfers and standing activity.    Follow Up Recommendations  SNF;Supervision/Assistance - 24 hour     Equipment Recommendations  Other (comment) (TBA)    Recommendations for Other Services       Precautions / Restrictions Precautions Precautions: Fall Precaution Comments: reports multiple falls since he went home after CVA    Mobility  Bed Mobility Overal bed mobility: Needs Assistance Bed Mobility: Supine to Sit     Supine to sit: Mod assist;+2 for safety/equipment;+2 for physical assistance     General bed mobility comments: Cues for direction.  assist to come forward up on L elbow then upright.  pt need assist to scoot R side due to incoordination of the the R UE.  Transfers Overall transfer level: Needs assistance Equipment used: Rolling walker (2 wheeled);None Transfers: Sit to/from [employer] Transfers Sit to Stand: Mod assist;Max assist;+2 physical assistance;+2 safety/equipment Stand pivot transfers: Mod assist;+2 physical assistance Squat pivot transfers: Mod assist;+2 safety/equipment     General transfer comment:  Cues for technique, hand placement.  assist to come forward and keep pt forward due to tends to list posteriorly consistently also boost up.  Ambulation/Gait                 Stairs             Wheelchair Mobility    Modified Rankin (Stroke Patients Only) Modified Rankin (Stroke Patients Only) Pre-Morbid Rankin Score: No symptoms Modified Rankin: Severe disability     Balance Overall balance assessment: History of Falls;Needs assistance   Sitting balance-Leahy Scale: Fair     Standing balance support: Bilateral upper extremity supported Standing balance-Leahy Scale: Poor Standing balance comment: significant time spent in standing to complete pericare from copious incontinence of urine and stool.  Pt needing min to mod assist depending on fatigue.                            Cognition Arousal/Alertness: Awake/alert Behavior During Therapy: WFL for tasks assessed/performed Overall Cognitive Status: Within Functional Limits for tasks assessed                                        Exercises Other Exercises Other Exercises: warm up ROM exercise to bil knees with gross ext resistance.    General Comments General comments (skin integrity, edema, etc.): sats 97% on 2L and ~ 94% on RA, but trending down..        Pertinent Vitals/Pain Pain Assessment: Faces Faces Pain Scale: Hurts even more Pain Location: bil knees;  Right worse Pain Descriptors / Indicators: Grimacing;Constant;Tender Pain Intervention(s): Monitored during session;Limited activity within patient's tolerance    Home Living                      Prior Function            PT Goals (current goals can now be found in the care plan section) Acute Rehab PT Goals Patient Stated Goal: to have less pain in his knees; to stop falling PT Goal Formulation: With patient Time For Goal Achievement: 03/25/20 Potential to Achieve Goals: Fair Progress towards PT goals:  Progressing toward goals    Frequency    Min 2X/week      PT Plan Current plan remains appropriate    Co-evaluation              AM-PAC PT "6 Clicks" Mobility   Outcome Measure  Help needed turning from your back to your side while in a flat bed without using bedrails?: A Lot Help needed moving from lying on your back to sitting on the side of a flat bed without using bedrails?: A Lot Help needed moving to and from a bed to a chair (including a wheelchair)?: A Lot Help needed standing up from a chair using your arms (e.g., wheelchair or bedside chair)?: A Lot Help needed to walk in hospital room?: Total Help needed climbing 3-5 steps with a railing? : Total 6 Click Score: 10    End of Session   Activity Tolerance: Patient limited by pain Patient left: in chair;with chair alarm set;with call bell/phone within reach Nurse Communication: Mobility status PT Visit Diagnosis: Hemiplegia and hemiparesis;Pain;Difficulty in walking, not elsewhere classified (R26.2);Other abnormalities of gait and mobility (R26.89) Hemiplegia - Right/Left: Right Hemiplegia - dominant/non-dominant: Dominant Hemiplegia - caused by: Cerebral infarction Pain - part of body:  (knees bil)     Time: 9735-3299 PT Time Calculation (min) (ACUTE ONLY): 42 min  Charges:  $Therapeutic Activity: 23-37 mins $Neuromuscular Re-education: 8-22 mins                     03/15/2020  Ginger Carne., PT Acute Rehabilitation Services [phone]  (pager) [phone]  (office)   [name] 03/15/2020, 6:00 PM

## 2020-03-15 NOTE — Progress Notes (Signed)
SATURATION QUALIFICATIONS: (This note is used to comply with regulatory documentation for home oxygen)  Patient Saturations on Room Air at Rest = 87%   Please briefly explain why patient needs home oxygen:  Patient Desats to 87% on Room Air while at rest and complains of shortness of breath. RN could not obtain ambulating sat because patient refused to ambulate due to pain.

## 2020-03-15 NOTE — Progress Notes (Deleted)
Physical Therapy Treatment Patient Details Name: Corey Esparza MRN: [id number] DOB: 1945/10/18 Today's Date: 03/15/2020    History of Present Illness 74 y.o. male with medical history significant of PAF on Eliquis, CVA, HTN, HLD, and DM2 presented to person [facility] on 6/7 with complaints of weakness and inability to care for himself at home. 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 Transferred to [facility] 03/10/20 due to afib with RVR, rising troponins, and ?need for cardiac intervention. Converted to NSR with amiodorone. GI consult due to melena, anemia.     PT Comments    Work on w/shifting, unweighting each foot, stepping and balance while completing basic and car transfers.   Follow Up Recommendations  SNF;Supervision/Assistance - 24 hour     Equipment Recommendations  Other (comment)    Recommendations for Other Services       Precautions / Restrictions Precautions Precautions: Fall Precaution Comments: reports multiple falls since he went home after CVA    Mobility  Bed Mobility Overal bed mobility: Needs Assistance Bed Mobility: Supine to Sit     Supine to sit: Mod assist;+2 for safety/equipment;+2 for physical assistance     General bed mobility comments: Cues for direction.  assist to come forward up on L elbow then upright.  pt need assist to scoot R side due to incoordination of the the R UE.  Transfers Overall transfer level: Needs assistance Equipment used: Rolling walker (2 wheeled);None Transfers: Stand Pivot Transfers Sit to Stand: Mod assist Stand pivot transfers: Mod assist;Max assist Squat pivot transfers: Mod assist;+2 safety/equipment     General transfer comment: face to face transfer used to transfer recliner to transport chair and transport chair to SUV.  More assist need for car transfer.  Ambulation/Gait                  Stairs             Wheelchair Mobility    Modified Rankin (Stroke Patients Only) Modified Rankin (Stroke Patients Only) Pre-Morbid Rankin Score: No symptoms Modified Rankin: Severe disability     Balance Overall balance assessment: History of Falls;Needs assistance   Sitting balance-Leahy Scale: Fair     Standing balance support: Bilateral upper extremity supported Standing balance-Leahy Scale: Poor Standing balance comment: pt consistently with posterior lean.  Worked on w/shifting and stepping to pivot within a confined space to get into the car.                            Cognition Arousal/Alertness: Awake/alert Behavior During Therapy: WFL for tasks assessed/performed Overall Cognitive Status: Within Functional Limits for tasks assessed                                        Exercises Other Exercises Other Exercises: warm up ROM exercise to bil knees with gross ext resistance.    General Comments General comments (skin integrity, edema, etc.): sats 97% on 2L and ~ 94% on RA, but trending down..        Pertinent Vitals/Pain Pain Assessment: Faces Faces Pain Scale: Hurts even more Pain Location: bil knees; Right worse Pain Descriptors / Indicators: Grimacing;Constant;Tender Pain Intervention(s): Monitored during session;Limited activity within patient's tolerance    Home Living  Prior Function            PT Goals (current goals can now be found in the care plan section) Acute Rehab PT Goals Patient Stated Goal: to have less pain in his knees; to stop falling PT Goal Formulation: With patient Time For Goal Achievement: 03/25/20 Potential to Achieve Goals: Fair Progress towards PT goals: Progressing toward goals    Frequency    Min 2X/week      PT Plan Current plan remains appropriate    Co-evaluation              AM-PAC PT "6 Clicks" Mobility   Outcome Measure  Help  needed turning from your back to your side while in a flat bed without using bedrails?: A Lot Help needed moving from lying on your back to sitting on the side of a flat bed without using bedrails?: A Lot Help needed moving to and from a bed to a chair (including a wheelchair)?: A Lot Help needed standing up from a chair using your arms (e.g., wheelchair or bedside chair)?: A Lot Help needed to walk in hospital room?: Total Help needed climbing 3-5 steps with a railing? : Total 6 Click Score: 10    End of Session   Activity Tolerance: Patient tolerated treatment well;Patient limited by pain Patient left: Other (comment) (car for transport to SNF) Nurse Communication: Mobility status PT Visit Diagnosis: Hemiplegia and hemiparesis;Pain;Difficulty in walking, not elsewhere classified (R26.2);Other abnormalities of gait and mobility (R26.89) Hemiplegia - Right/Left: Right Hemiplegia - dominant/non-dominant: Dominant Hemiplegia - caused by: Cerebral infarction Pain - part of body: Knee     Time: 1700-1730 PT Time Calculation (min) (ACUTE ONLY): 30 min  Charges:  $Therapeutic Activity: 23-37 mins $Neuromuscular Re-education: 8-22 mins                     03/15/2020  Ginger Carne., PT Acute Rehabilitation Services [phone]  (pager) [phone]  (office)   Corey Esparza 03/15/2020, 6:07 PM

## 2020-03-15 NOTE — TOC Progression Note (Addendum)
Transition of Care [facility]) - Progression Note    Patient Details  Name: Corey Esparza MRN: [id number] Date of Birth: [birth year]  Transition of Care [facility]) CM/SW [place], LCSW Phone Number: 03/15/2020, 9:41 AM  Clinical Narrative:    9am-CSW still awaiting insurance approval but should be approved today. CSW spoke with Olivia Mackie in Admissions at [facility] and she is ready for patient once approval comes through. Will fax dc summary and COVID results.  10am: Insurance approval received: [id number] (ID: [id number]) through 03/17/20. CSW updated patient's daughter and she will plan to be here around 1pm to help transport patient to SNF.  11am-Per MD, patient will require oxygen for transportation. [facility] unable to provide transportation. CSW obtained quotes from [facility] [phone]), [facility] [phone]) and [facility] [phone]). Daughter reports she and patient are unable to afford that. She is contacting her local pharmacy to see if they would lend her a tank. CSW spoke with [employer] ([employer] and [employer] unable to provide any O2). [facility] reported that patient's daughter can contact the facility manager, Iona Beard [phone]) to get a quote for a single tank which should last four hours on 2L ($44/tank plus cost of regulator must be in cash). Daughter would be able to pick up the supplies at [place]. CW awaiting confirmation from [employer], [place], and a call back from patient's daughter.   11:44am-Daughter unable to obtain oxygen from pharmacy but they gave her contact info for [facility]. CSW awaiting confirmation from Supervisor. Daughter unsure if she will be able to obtain cash; she is contacting patient's neighbor who has his debit card.  1pm-TOC Supervisor stated as long as MD is fine with oxygen plan, daughter can pick it up and bring it to the hospital for transport. Patient's daughter asking if she can borrow a tank from [facility]. Their  Administrator, Cristie Hem, reports she can pick one up from them and bring it to [facility] to pick up the patient and drop it off once the patient arrives at [facility]. Patient's daughter aware and will start heading that way since she lives in [place]. MD aware and in agreement with plan. RN aware.   Expected Discharge Plan: Skilled Nursing Facility Barriers to Discharge: Ship broker, Continued Medical Work up  Expected Discharge Plan and Services Expected Discharge Plan: [place] In-house Referral: Clinical Social Work Discharge Planning Services: CM Consult   Living arrangements for the past 2 months: Single Family Home                                       Social Determinants of Health (SDOH) Interventions    Readmission Risk Interventions Readmission Risk Prevention Plan 03/14/2020  Post Dischage Appt Not Complete  Appt Comments plan for SNF  Medication Screening Complete  Transportation Screening Complete

## 2020-03-15 NOTE — TOC Transition Note (Addendum)
Transition of Care [facility]) - CM/SW Discharge Note   Patient Details  Name: Corey Esparza MRN: [id number] Date of Birth: Feb 14, 1946  Transition of Care [facility]) CM/SW Contact:  Benard Halsted, LCSW Phone Number: 03/15/2020, 1:07 PM   Clinical Narrative:    [facility] will DC to: [facility] Anticipated DC date: 03/15/20 Family notified: Daughter, Janett Billow Transport by: Janett Billow by car-she will pick up oxygen tank at [facility] prior to coming to pick the patient up in a few hours.   Please make sure signed scripts go with patient. Placed in patient's room.   Per MD patient ready for DC to [facility]. RN, patient, patient's family, and facility notified of DC. Discharge Summary, COVID test, and FL2 sent to facility. RN to call report prior to discharge [phone]).    CSW will sign off for now as social work intervention is no longer needed. Please consult [place] again if new needs arise.      Final next level of care: Skilled Nursing Facility Barriers to Discharge: No Barriers Identified   Patient Goals and CMS Choice Patient states their goals for this hospitalization and ongoing recovery are:: needs rehab CMS Medicare.gov Compare Post Acute Care list provided to:: Patient Choice offered to / list presented to : Patient, Adult Children  Discharge Placement   Existing PASRR number confirmed : 03/15/20          Patient chooses bed at: Other - please specify in the comment section below: ([place], [place]) Patient to be transferred to facility by: Patient's daughter by car Name of family member notified: Daughter, Janett Billow Patient and family notified of of transfer: 03/15/20  Discharge Plan and Services In-house Referral: Clinical Social Work Discharge Planning Services: CM Consult                                 Social Determinants of Health (SDOH) Interventions     Readmission Risk Interventions Readmission Risk Prevention Plan 03/14/2020  Post  Dischage Appt Not Complete  Appt Comments plan for SNF  Medication Screening Complete  Transportation Screening Complete

## 2020-03-15 NOTE — Progress Notes (Signed)
Later Progress Note  Worked on w/shifting, unweighting, stepping, pre gait activities and both basic and car transfers.    03/15/20 1802  PT Visit Information  Last PT Received On 03/15/20  Assistance Needed +2  History of Present Illness 74 y.o. male with medical history significant of PAF on Eliquis, CVA, HTN, HLD, and DM2 presented to person [facility] on 6/7 with complaints of weakness and inability to care for himself at home. 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 Transferred to [facility] 03/10/20 due to afib with RVR, rising troponins, and ?need for cardiac intervention. Converted to NSR with amiodorone. GI consult due to melena, anemia.   Subjective Data  Patient Stated Goal to have less pain in his knees; to stop falling  Precautions  Precaution Comments reports multiple falls since he went home after CVA  Pain Assessment  Pain Assessment Faces  Faces Pain Scale 6  Pain Location bil knees; Right worse  Pain Intervention(s) Monitored during session;Limited activity within patient's tolerance  Cognition  Arousal/Alertness Awake/alert  Behavior During Therapy WFL for tasks assessed/performed  Overall Cognitive Status Within Functional Limits for tasks assessed  Transfers  Overall transfer level Needs assistance  Transfers Stand Pivot Transfers  Sit to Stand Mod assist  Stand pivot transfers Mod assist;Max assist  General transfer comment face to face transfer used to transfer recliner to transport chair and transport chair to SUV.  More assist need for car transfer.  Modified Rankin (Stroke Patients Only)  Modified Rankin 5  Balance  Sitting balance-Leahy Scale Fair  Standing balance-Leahy Scale Poor  Standing balance comment pt consistently with posterior lean.  Worked on w/shifting and stepping to pivot within a confined space to get into the car.  PT - End of  Session  Activity Tolerance Patient tolerated treatment well;Patient limited by pain  Patient left Other (comment) (car for transport to SNF)  Nurse Communication Mobility status   PT - Assessment/Plan  PT Plan Current plan remains appropriate  PT Visit Diagnosis Hemiplegia and hemiparesis;Pain;Difficulty in walking, not elsewhere classified (R26.2);Other abnormalities of gait and mobility (R26.89)  Hemiplegia - Right/Left Right  Hemiplegia - dominant/non-dominant Dominant  Hemiplegia - caused by Cerebral infarction  Pain - part of body Knee  PT Frequency (ACUTE ONLY) Min 2X/week  Follow Up Recommendations SNF;Supervision/Assistance - 24 hour  PT equipment Other (comment)  AM-PAC PT "6 Clicks" Mobility Outcome Measure (Version 2)  Help needed turning from your back to your side while in a flat bed without using bedrails? 2  Help needed moving from lying on your back to sitting on the side of a flat bed without using bedrails? 2  Help needed moving to and from a bed to a chair (including a wheelchair)? 2  Help needed standing up from a chair using your arms (e.g., wheelchair or bedside chair)? 2  Help needed to walk in hospital room? 1  Help needed climbing 3-5 steps with a railing?  1  6 Click Score 10  Consider Recommendation of Discharge To: CIR/SNF/LTACH  PT Goal Progression  Progress towards PT goals Progressing toward goals  Acute Rehab PT Goals  PT Goal Formulation With patient  Time For Goal Achievement 03/25/20  Potential to Achieve Goals Fair  PT Time Calculation  PT Start Time (ACUTE ONLY) 1700  PT Stop Time (ACUTE ONLY) 1730  PT Time Calculation (min) (ACUTE ONLY) 30 min  PT General  Charges  $$ ACUTE PT VISIT 1 Visit  PT Treatments  $Therapeutic Activity 23-37 mins   03/15/2020  Ginger Carne., PT Acute Rehabilitation Services [phone]  (pager) [phone]  (office)

## 2020-03-15 NOTE — Care Management Important Message (Signed)
Important Message  Patient Details  Name: Emmette Katt MRN: [id number] Date of Birth: 07-11-1946   Medicare Important Message Given:  Yes - Important Message mailed due to current National Emergency  Verbal consent obtained due to current National Emergency  Relationship to patient: Self Contact Name: Asberry Lascola Call Date: 03/15/20  Time: 1115 Phone: [phone] Outcome: No Answer/Busy Important Message mailed to: Patient address on file    Delorse Lek 03/15/2020, 11:16 AM

## 2020-03-15 NOTE — Discharge Summary (Signed)
Physician Discharge Summary  Corey Esparza TKW:[id number] DOB: 1946/08/28 DOA: 03/10/2020  PCP: Patient, No Pcp Per  Admit date: 03/10/2020 Discharge date: 03/15/2020  Admitted From: Home via person [facility] Disposition: Skilled nursing facility  Recommendations for Outpatient Follow-up:  1. Follow up with PCP in 1-2 weeks after discharge 2. Please obtain CBC every week for 1 month.  3. Obtain CT scan of the right knee once mobility improvement.  Equipment/Devices: Intermittently needing supplemental oxygen.  Discharge Condition: Fair CODE STATUS: Full code Diet recommendation: Low-salt low-carb diet  Discharge summary: 74 year old gentleman with history of paroxysmal A. 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  Patient was recently hospitalized on 5/10-5/14 to [facility] where MRI showed acute infarction in the posterior limb of the left internal capsule along with multiple chronic CVA.  He was sent home on Eliquis and metoprolol.  Patient was found at home unable to take care of himself, extremely weak and was brought back to the hospital.  Repeat CT scan of the head did not show any evidence of new stroke.  Labs revealed hemoglobin 8.3 with positive Hemoccult.  Noted to be melanotic stool.  Hemoglobin dropped as low as 7.9, received total 2 units of PRBC.  6/10 overnight complained of chest pain or shortness of breath, found to have A. fib with RVR and troponin elevated to 6.6.  Mild temperature 100.8.  Patient was transferred to [facility] for cardiology and further endoscopic care.  Seen and followed by cardiology and GI.  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 EGD and colonoscopy done at person [facility], no significant source of bleeding. 6/11- capsule endoscopy done, found to have vascular ectasias.  No major bleeding.  Hemoglobin is stabilized. Challenging with Eliquis.  Hemoglobin is stable.  Will need very close monitoring.  #2 paroxysmal A. fib with RVR, elevated troponin and suspected non-STEMI: Reason for transfer to this hospital.  Currently remains asymptomatic and chest pain-free. Treated with amiodarone, converted to sinus rhythm.   Rate controlled on metoprolol and amiodarone.  Appreciate cardiology input.  Eliquis resumed.  Cardiology to schedule outpatient follow-up plan.  #3 left MCA stroke with right hemiparesis:  Work with PT/ OT.  Patient will benefit with inpatient therapies.  Refer to skilled nursing facility. On antihypertensives, atorvastatin and Eliquis.  #4 type 2 diabetes on Metformin: Holding Metformin.  Currently on sliding scale insulin.  Resume Metformin on discharge.  #5. bilateral knee pain, osteoarthritis, right upper tibial intraosseous lesion: Patient to use adequate pain medications for mobility.  Has advanced osteoarthritic changes on bilateral knees.  Radiology recommended a follow-up CT scan.  Once patient gets good rehab, please order a CT scan of the knee and if needed refer to orthopedics.  # 6. Hypoxia: Cause unknown.  Patient to use incentive spirometry.  He is a smoker and has a smoker's cough.  Probably causing low oxygenation.  Oxygen saturations as low as 87% at rest.  On 2 L oxygen. Bronchodilators.  Supplemental oxygen.  Patient has chronic medical issues, poor mobility.  Deconditioned with ongoing medical issues.  He will benefit with inpatient therapies at a skilled nursing rehab.  Stable to transfer to a skilled level of care.  He is a  stable to travel on private vehicle. Needing supplemental oxygen 2 liters per day.  Discharge Diagnoses:  Principal Problem:   GI bleed Active  Problems:   AF (paroxysmal atrial fibrillation) (HCC)   Essential hypertension   SIRS (systemic inflammatory response syndrome) (HCC)   Hypomagnesemia   History of CVA (cerebrovascular accident)   HLD (hyperlipidemia)   Tobacco abuse    Discharge Instructions  Discharge Instructions    Diet - low sodium heart healthy   Complete by: As directed    Diet Carb Modified   Complete by: As directed    Increase activity slowly   Complete by: As directed      Allergies as of 03/15/2020   No Known Allergies     Medication List    STOP taking these medications   hydrochlorothiazide 25 MG tablet Commonly known as: HYDRODIURIL     TAKE these medications   albuterol 108 (90 Base) MCG/ACT inhaler Commonly known as: VENTOLIN HFA Inhale 1 puff into the lungs every 4 (four) hours as needed for shortness of breath.   amiodarone 200 MG tablet Commonly known as: PACERONE Take 1 tablet (200 mg total) by mouth 2 (two) times daily.   atorvastatin 40 MG tablet Commonly known as: LIPITOR Take 40 mg by mouth at bedtime.   Eliquis 5 MG Tabs tablet Generic drug: apixaban Take 5 mg by mouth 2 (two) times daily.   furosemide 20 MG tablet Commonly known as: LASIX Take 20 mg by mouth daily.   HYDROcodone-acetaminophen 5-325 MG tablet Commonly known as: NORCO/VICODIN Take 1 tablet by mouth every 4 (four) hours as needed for up to 5 days for moderate pain or severe pain.   lisinopril 10 MG tablet Commonly known as: ZESTRIL Take 10 mg by mouth daily.   metFORMIN 500 MG tablet Commonly known as: GLUCOPHAGE Take 750 mg by mouth in the morning and at bedtime.   metoprolol tartrate 25 MG tablet Commonly known as: LOPRESSOR Take 25 mg by mouth 2 (two) times daily.   pantoprazole 40 MG tablet Commonly known as: PROTONIX Take 1 tablet (40 mg total) by mouth 2 (two) times daily.       No Known Allergies  Consultations:  Gastroenterology  Cardiology   Procedures/Studies: DG  Knee 1-2 Views Right  Result Date: 03/10/2020 CLINICAL DATA:  RIGHT knee pain EXAM: RIGHT KNEE - 1-2 VIEW COMPARISON:  02/09/2020 FINDINGS: Advanced degenerative change in the RIGHT knee with complete loss of the joint space in the lateral compartment. Patellofemoral osteoarthritic changes as well as marked narrowing of the joint space in the medial compartment. Lucency with central increased density in the proximal RIGHT tibia measuring 2.5 x 1.9 cm. Slight enlargement of joint effusion. No acute fracture or dislocation. IMPRESSION: 1. 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 2. 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 There is history of malignancy MRI or CT for further assessment could be considered. 3. Slight enlargement of joint effusion. Electronically Signed   By: Zetta Bills M.D.   On: 03/10/2020 21:54   ECHOCARDIOGRAM COMPLETE  Result Date: 03/10/2020    ECHOCARDIOGRAM REPORT   Patient Name:   Corey Esparza Date of Exam: 03/10/2020 Medical Rec #:  [id number]       Height:       71.0 in Accession #:    [id number]      Weight:       200.0 lb Date of Birth:  [birth year]        BSA:  2.109 m Patient Age:    4 years        BP:           117/64 mmHg Patient Gender: M               HR:           78 bpm. Exam Location:  Inpatient Procedure: 2D Echo, Cardiac Doppler, Color Doppler and Intracardiac            Opacification Agent Indications:    Elevated troponin  History:        Patient has no prior history of Echocardiogram examinations.  Sonographer:    Clayton Lefort RDCS (AE) Referring Phys: [id number] [facility] SM[facility]  Sonographer Comments: Suboptimal parasternal window. Image acquisition challenging due to respiratory motion. IMPRESSIONS  1. Left ventricular ejection fraction, by estimation, is 40 to 45%. The left ventricle has mildly decreased function. The left ventricle has no regional wall motion abnormalities. There is moderate left ventricular hypertrophy. Left ventricular diastolic  parameters are consistent with Grade I diastolic dysfunction (impaired relaxation).  2. Right ventricular systolic function is normal. The right ventricular size is normal.  3. The mitral valve is normal in structure. No evidence of mitral valve regurgitation. No evidence of mitral stenosis.  4. The aortic valve is tricuspid. Aortic valve regurgitation is not visualized. Mild to moderate aortic valve sclerosis/calcification is present, without any evidence of aortic stenosis.  5. The inferior vena cava is dilated in size with <50% respiratory variability, suggesting right atrial pressure of 15 mmHg. FINDINGS  Left Ventricle: Left ventricular ejection fraction, by estimation, is 40 to 45%. The left ventricle has mildly decreased function. The left ventricle has no regional wall motion abnormalities. Definity contrast agent was given IV to delineate the left ventricular endocardial borders. The left ventricular internal cavity size was normal in size. There is moderate left ventricular hypertrophy. Left ventricular diastolic parameters are consistent with Grade I diastolic dysfunction (impaired relaxation).  LV Wall Scoring: The mid anteroseptal segment and mid anterior segment are akinetic. Right Ventricle: The right ventricular size is normal. No increase in right ventricular wall thickness. Right ventricular systolic function is normal. Left Atrium: Left atrial size was normal in size. Right Atrium: Right atrial size was normal in size. Pericardium: There is no evidence of pericardial effusion. Mitral Valve: The mitral valve is normal in structure. Normal mobility of the mitral valve leaflets. Moderate mitral annular calcification. No evidence of mitral valve regurgitation. No evidence of mitral valve stenosis. MV peak gradient, 4.4 mmHg. The mean mitral valve gradient is 2.0 mmHg. Tricuspid Valve: The tricuspid valve is normal in structure. Tricuspid valve regurgitation is not demonstrated. No evidence of  tricuspid stenosis. Aortic Valve: The aortic valve is tricuspid. . There is moderate thickening and moderate calcification of the aortic valve. Aortic valve regurgitation is not visualized. Mild to moderate aortic valve sclerosis/calcification is present, without any evidence of aortic stenosis. There is moderate thickening of the aortic valve. There is moderate calcification of the aortic valve. Aortic valve mean gradient measures 3.0 mmHg. Aortic valve peak gradient measures 4.9 mmHg. Aortic valve area, by VTI measures 2.74 cm. Pulmonic Valve: The pulmonic valve was normal in structure. Pulmonic valve regurgitation is not visualized. No evidence of pulmonic stenosis. Aorta: The aortic root is normal in size and structure. Venous: The inferior vena cava is dilated in size with less than 50% respiratory variability, suggesting right atrial pressure of 15 mmHg. IAS/Shunts: No atrial level shunt  detected by color flow Doppler.  LEFT VENTRICLE PLAX 2D LVIDd:         4.10 cm LVIDs:         3.20 cm LV PW:         1.60 cm LV IVS:        1.70 cm LVOT diam:     2.00 cm LV SV:         62 LV SV Index:   29 LVOT Area:     3.14 cm  RIGHT VENTRICLE             IVC RV Basal diam:  2.60 cm     IVC diam: 2.50 cm RV S prime:     10.80 cm/s TAPSE (M-mode): 1.6 cm LEFT ATRIUM             Index       RIGHT ATRIUM           Index LA diam:        3.90 cm 1.85 cm/m  RA Area:     17.30 cm LA Vol (A2C):   61.6 ml 29.21 ml/m RA Volume:   51.60 ml  24.47 ml/m LA Vol (A4C):   53.6 ml 25.42 ml/m LA Biplane Vol: 60.9 ml 28.88 ml/m  AORTIC VALVE AV Area (Vmax):    2.43 cm AV Area (Vmean):   2.27 cm AV Area (VTI):     2.74 cm AV Vmax:           111.00 cm/s AV Vmean:          85.100 cm/s AV VTI:            0.225 m AV Peak Grad:      4.9 mmHg AV Mean Grad:      3.0 mmHg LVOT Vmax:         85.70 cm/s LVOT Vmean:        61.600 cm/s LVOT VTI:          0.196 m LVOT/AV VTI ratio: 0.87  AORTA Ao Root diam: 3.10 cm MITRAL VALVE MV Area (PHT):  3.55 cm  SHUNTS MV Peak grad:  4.4 mmHg  Systemic VTI:  0.20 m MV Mean grad:  2.0 mmHg  Systemic Diam: 2.00 cm MV Vmax:       1.05 m/s MV Vmean:      63.0 cm/s Candee Furbish MD Electronically signed by Candee Furbish MD Signature Date/Time: 03/10/2020/2:17:06 PM    Final    (Echo, Carotid, EGD, Colonoscopy, ERCP)    Subjective: Patient seen and examined.  He has some cough.  Denies any other complaints.  No overnight events.  Nursing reported resting oxygen less than 90%.  Patient himself denies any shortness of breath.  Does have some cough with mucoid sputum production.   Discharge Exam: Vitals:   03/15/20 0740 03/15/20 0845  BP: (!) 105/56   Pulse: 64   Resp: 15   Temp: 97.6 F (36.4 C)   SpO2: 93% (!) 87%   Vitals:   03/14/20 2355 03/15/20 0400 03/15/20 0740 03/15/20 0845  BP: 119/61 (!) 126/57 (!) 105/56   Pulse: 63 64 64   Resp: 18 18 15    Temp: 98.5 F (36.9 C) 97.8 F (36.6 C) 97.6 F (36.4 C)   TempSrc: Oral Oral Oral   SpO2: 95% 93% 93% (!) 87%  Weight:      Height:        General: Pt is alert, awake, not in  acute distress, on 2 L oxygen.  Chronically sick looking but not in any distress. Cardiovascular: RRR, S1/S2 +, no rubs, no gallops Respiratory: CTA bilaterally, no wheezing, no rhonchi, no added sounds. Abdominal: Soft, NT, ND, bowel sounds + Extremities: Bilateral stiff knee.  Right leg is weaker than left leg and also much restricted due to pain in the knees.    The results of significant diagnostics from this hospitalization (including imaging, microbiology, ancillary and laboratory) are listed below for reference.     Microbiology: Recent Results (from the past 240 hour(s))  MRSA PCR Screening     Status: None   Collection Time: 03/10/20  5:41 AM   Specimen: Nasal Mucosa; Nasopharyngeal  Result Value Ref Range Status   MRSA by PCR NEGATIVE NEGATIVE Final    Comment:        The GeneXpert MRSA Assay (FDA approved for NASAL specimens only), is one  component of a comprehensive MRSA colonization surveillance program. It is not intended to diagnose MRSA infection nor to guide or monitor treatment for MRSA infections. Performed at [facility], [place] [address] [place], [place] [address]   SARS Coronavirus 2 by RT PCR (hospital order, performed in [facility] hospital lab) Nasopharyngeal Nasopharyngeal Swab     Status: None   Collection Time: 03/11/20 11:15 AM   Specimen: Nasopharyngeal Swab  Result Value Ref Range Status   SARS Coronavirus 2 NEGATIVE NEGATIVE Final    Comment: (NOTE) SARS-CoV-2 target nucleic acids are NOT DETECTED.  The SARS-CoV-2 RNA is generally detectable in upper and lower respiratory specimens during the acute phase of infection. The lowest concentration of SARS-CoV-2 viral copies this assay can detect is 250 copies / mL. A negative result does not preclude SARS-CoV-2 infection and should not be used as the sole basis for treatment or other patient management decisions.  A negative result may occur with improper specimen collection / handling, submission of specimen other than nasopharyngeal swab, presence of viral mutation(s) within the areas targeted by this assay, and inadequate number of viral copies (<250 copies / mL). A negative result must be combined with clinical observations, patient history, and epidemiological information.  Fact Sheet for Patients:   StrictlyIdeas.no  Fact Sheet for Healthcare Providers: BankingDealers.co.za  This test is not yet approved or  cleared by the [place] FDA and has been authorized for detection and/or diagnosis of SARS-CoV-2 by FDA under an Emergency Use Authorization (EUA).  This EUA will remain in effect (meaning this test can be used) for the duration of the COVID-19 declaration under Section 564(b)(1) of the Act, 21 U.S.C. section 360bbb-3(b)(1), unless the authorization is terminated or revoked  sooner.  Performed at [facility], [place] [address] [place], [place] [address]   SARS CORONAVIRUS 2 (TAT 6-24 HRS) Nasopharyngeal Nasopharyngeal Swab     Status: None   Collection Time: 03/14/20  3:54 PM   Specimen: Nasopharyngeal Swab  Result Value Ref Range Status   SARS Coronavirus 2 NEGATIVE NEGATIVE Final    Comment: (NOTE) SARS-CoV-2 target nucleic acids are NOT DETECTED.  The SARS-CoV-2 RNA is generally detectable in upper and lower respiratory specimens during the acute phase of infection. Negative results do not preclude SARS-CoV-2 infection, do not rule out co-infections with other pathogens, and should not be used as the sole basis for treatment or other patient management decisions. Negative results must be combined with clinical observations, patient history, and epidemiological information. The expected result is Negative.  Fact Sheet  for Patients: SugarRoll.be  Fact Sheet for Healthcare Providers: https://www.woods-mathews.com/  This test is not yet approved or cleared by the [place] FDA and  has been authorized for detection and/or diagnosis of SARS-CoV-2 by FDA under an Emergency Use Authorization (EUA). This EUA will remain  in effect (meaning this test can be used) for the duration of the COVID-19 declaration under Se ction 564(b)(1) of the Act, 21 U.S.C. section 360bbb-3(b)(1), unless the authorization is terminated or revoked sooner.  Performed at [facility], [place] [address], [place], [place] [address]      Labs: BNP (last 3 results) No results for input(s): BNP in the last 8760 hours. Basic Metabolic Panel: Recent Labs  Lab 03/10/20 0619 03/10/20 0844 03/11/20 0355 03/12/20 0521  NA 140  --  138 133*  K 3.6  --  3.5 3.5  CL 111  --  108 104  CO2 21*  --  20* 20*  GLUCOSE 142*  --  132* 136*  BUN 10  --  8 8  CREATININE 1.02  --  1.08 1.01  CALCIUM 7.9*  --  7.8* 7.8*  MG  --   1.5*  --  1.6*  PHOS  --   --   --  2.3*   Liver Function Tests: Recent Labs  Lab 03/10/20 0619 03/12/20 0521  AST 46* 19  ALT 21 14  ALKPHOS 55 45  BILITOT 1.5* 1.2  PROT 5.4* 5.0*  ALBUMIN 2.7* 2.3*   No results for input(s): LIPASE, AMYLASE in the last 168 hours. No results for input(s): AMMONIA in the last 168 hours. CBC: Recent Labs  Lab 03/10/20 0619 03/10/20 1348 03/11/20 0355 03/12/20 0734 03/13/20 0300 03/14/20 0342 03/15/20 0400  WBC 7.7  --  6.9 6.3 6.8 3.9* 4.7  NEUTROABS 5.3  --   --  4.1 4.3 2.4 2.7  HGB 10.2*   < > 9.4* 8.9* 9.2* 8.4* 9.0*  HCT 31.0*   < > 28.9* 27.5* 28.0* 25.2* 27.3*  MCV 89.3  --  88.7 88.1 86.7 86.0 85.8  PLT 211  --  206 197 219 212 233   < > = values in this interval not displayed.   Cardiac Enzymes: No results for input(s): CKTOTAL, CKMB, CKMBINDEX, TROPONINI in the last 168 hours. BNP: Invalid input(s): POCBNP CBG: Recent Labs  Lab 03/13/20 1555 03/13/20 2043 03/14/20 0827 03/14/20 1157 03/14/20 2116  GLUCAP 119* 183* 119* 128* 139*   D-Dimer No results for input(s): DDIMER in the last 72 hours. Hgb A1c No results for input(s): HGBA1C in the last 72 hours. Lipid Profile No results for input(s): CHOL, HDL, LDLCALC, TRIG, CHOLHDL, LDLDIRECT in the last 72 hours. Thyroid function studies No results for input(s): TSH, T4TOTAL, T3FREE, THYROIDAB in the last 72 hours.  Invalid input(s): FREET3 Anemia work up No results for input(s): VITAMINB12, FOLATE, FERRITIN, TIBC, IRON, RETICCTPCT in the last 72 hours. Urinalysis No results found for: COLORURINE, APPEARANCEUR, [place], [place], [place], [place], [place], [place], PROTEINUR, UROBILINOGEN, NITRITE, LEUKOCYTESUR Sepsis Labs Invalid input(s): PROCALCITONIN,  WBC,  LACTICIDVEN Microbiology Recent Results (from the past 240 hour(s))  MRSA PCR Screening     Status: None   Collection Time: 03/10/20  5:41 AM   Specimen: Nasal Mucosa; Nasopharyngeal  Result Value  Ref Range Status   MRSA by PCR NEGATIVE NEGATIVE Final    Comment:        The GeneXpert MRSA Assay (FDA approved for NASAL specimens only), is one component of a  comprehensive MRSA colonization surveillance program. It is not intended to diagnose MRSA infection nor to guide or monitor treatment for MRSA infections. Performed at [facility], [place] [address] [place], [place] [address]   SARS Coronavirus 2 by RT PCR (hospital order, performed in [facility] hospital lab) Nasopharyngeal Nasopharyngeal Swab     Status: None   Collection Time: 03/11/20 11:15 AM   Specimen: Nasopharyngeal Swab  Result Value Ref Range Status   SARS Coronavirus 2 NEGATIVE NEGATIVE Final    Comment: (NOTE) SARS-CoV-2 target nucleic acids are NOT DETECTED.  The SARS-CoV-2 RNA is generally detectable in upper and lower respiratory specimens during the acute phase of infection. The lowest concentration of SARS-CoV-2 viral copies this assay can detect is 250 copies / mL. A negative result does not preclude SARS-CoV-2 infection and should not be used as the sole basis for treatment or other patient management decisions.  A negative result may occur with improper specimen collection / handling, submission of specimen other than nasopharyngeal swab, presence of viral mutation(s) within the areas targeted by this assay, and inadequate number of viral copies (<250 copies / mL). A negative result must be combined with clinical observations, patient history, and epidemiological information.  Fact Sheet for Patients:   StrictlyIdeas.no  Fact Sheet for Healthcare Providers: BankingDealers.co.za  This test is not yet approved or  cleared by the [place] FDA and has been authorized for detection and/or diagnosis of SARS-CoV-2 by FDA under an Emergency Use Authorization (EUA).  This EUA will remain in effect (meaning this test can be used) for the duration of  the COVID-19 declaration under Section 564(b)(1) of the Act, 21 U.S.C. section 360bbb-3(b)(1), unless the authorization is terminated or revoked sooner.  Performed at [facility], [place] [address] [place], [place] [address]   SARS CORONAVIRUS 2 (TAT 6-24 HRS) Nasopharyngeal Nasopharyngeal Swab     Status: None   Collection Time: 03/14/20  3:54 PM   Specimen: Nasopharyngeal Swab  Result Value Ref Range Status   SARS Coronavirus 2 NEGATIVE NEGATIVE Final    Comment: (NOTE) SARS-CoV-2 target nucleic acids are NOT DETECTED.  The SARS-CoV-2 RNA is generally detectable in upper and lower respiratory specimens during the acute phase of infection. Negative results do not preclude SARS-CoV-2 infection, do not rule out co-infections with other pathogens, and should not be used as the sole basis for treatment or other patient management decisions. Negative results must be combined with clinical observations, patient history, and epidemiological information. The expected result is Negative.  Fact Sheet for Patients: SugarRoll.be  Fact Sheet for Healthcare Providers: https://www.woods-mathews.com/  This test is not yet approved or cleared by the [place] FDA and  has been authorized for detection and/or diagnosis of SARS-CoV-2 by FDA under an Emergency Use Authorization (EUA). This EUA will remain  in effect (meaning this test can be used) for the duration of the COVID-19 declaration under Se ction 564(b)(1) of the Act, 21 U.S.C. section 360bbb-3(b)(1), unless the authorization is terminated or revoked sooner.  Performed at [facility], [place] [address], [place], [place] [address]      Time coordinating discharge:  40 minutes  SIGNED:   Barb Merino, MD  [facility] 03/15/2020, 1:02 PM

## 2020-03-15 NOTE — Discharge Instructions (Signed)
Do not use BC powders ===========================================  Information on my medicine - ELIQUIS (apixaban)  Why was Eliquis prescribed for you? Eliquis was prescribed for you to reduce the risk of a blood clot forming that can cause a stroke if you have a medical condition called atrial fibrillation (a type of irregular heartbeat).  What do You need to know about Eliquis ? Take your Eliquis TWICE DAILY - one tablet in the morning and one tablet in the evening with or without food. If you have difficulty swallowing the tablet whole please discuss with your pharmacist how to take the medication safely.  Take Eliquis exactly as prescribed by your doctor and DO NOT stop taking Eliquis without talking to the doctor who prescribed the medication.  Stopping may increase your risk of developing a stroke.  Refill your prescription before you run out.  After discharge, you should have regular check-up appointments with your healthcare provider that is prescribing your Eliquis.  In the future your dose may need to be changed if your kidney function or weight changes by a significant amount or as you get older.  What do you do if you miss a dose? If you miss a dose, take it as soon as you remember on the same day and resume taking twice daily.  Do not take more than one dose of ELIQUIS at the same time to make up a missed dose.  Important Safety Information A possible side effect of Eliquis is bleeding. You should call your healthcare provider right away if you experience any of the following: ? Bleeding from an injury or your nose that does not stop. ? Unusual colored urine (red or dark brown) or unusual colored stools (red or black). ? Unusual bruising for unknown reasons. ? A serious fall or if you hit your head (even if there is no bleeding).  Some medicines may interact with Eliquis and might increase your risk of bleeding or clotting while on Eliquis. To help avoid this, consult  your healthcare provider or pharmacist prior to using any new prescription or non-prescription medications, including herbals, vitamins, non-steroidal anti-inflammatory drugs (NSAIDs) and supplements.  This website has more information on Eliquis (apixaban): http://[email or web address].com/eliquis/home

## 2020-03-16 LAB — GLUCOSE, CAPILLARY
Glucose-Capillary: 147 mg/dL — ABNORMAL HIGH (ref 70–99)
Glucose-Capillary: 103 mg/dL — ABNORMAL HIGH (ref 70–99)
Glucose-Capillary: 202 mg/dL — ABNORMAL HIGH (ref 70–99)
Glucose-Capillary: 127 mg/dL — ABNORMAL HIGH (ref 70–99)
Glucose-Capillary: 121 mg/dL — ABNORMAL HIGH (ref 70–99)
Glucose-Capillary: 196 mg/dL — ABNORMAL HIGH (ref 70–99)
Glucose-Capillary: 137 mg/dL — ABNORMAL HIGH (ref 70–99)
Glucose-Capillary: 177 mg/dL — ABNORMAL HIGH (ref 70–99)

## 2020-05-31 DEATH — deceased

## 2021-07-08 IMAGING — DX DG KNEE 1-2V*R*
1 series · 2 of 2 positions shown · non-contrast
Comparison: 02/09/2020

CLINICAL DATA: RIGHT knee pain

EXAM:
RIGHT KNEE - 1-2 VIEW

[Series 1: knee · 0.14mm/px · 2 of 2 slices shown]
[im 1/2]
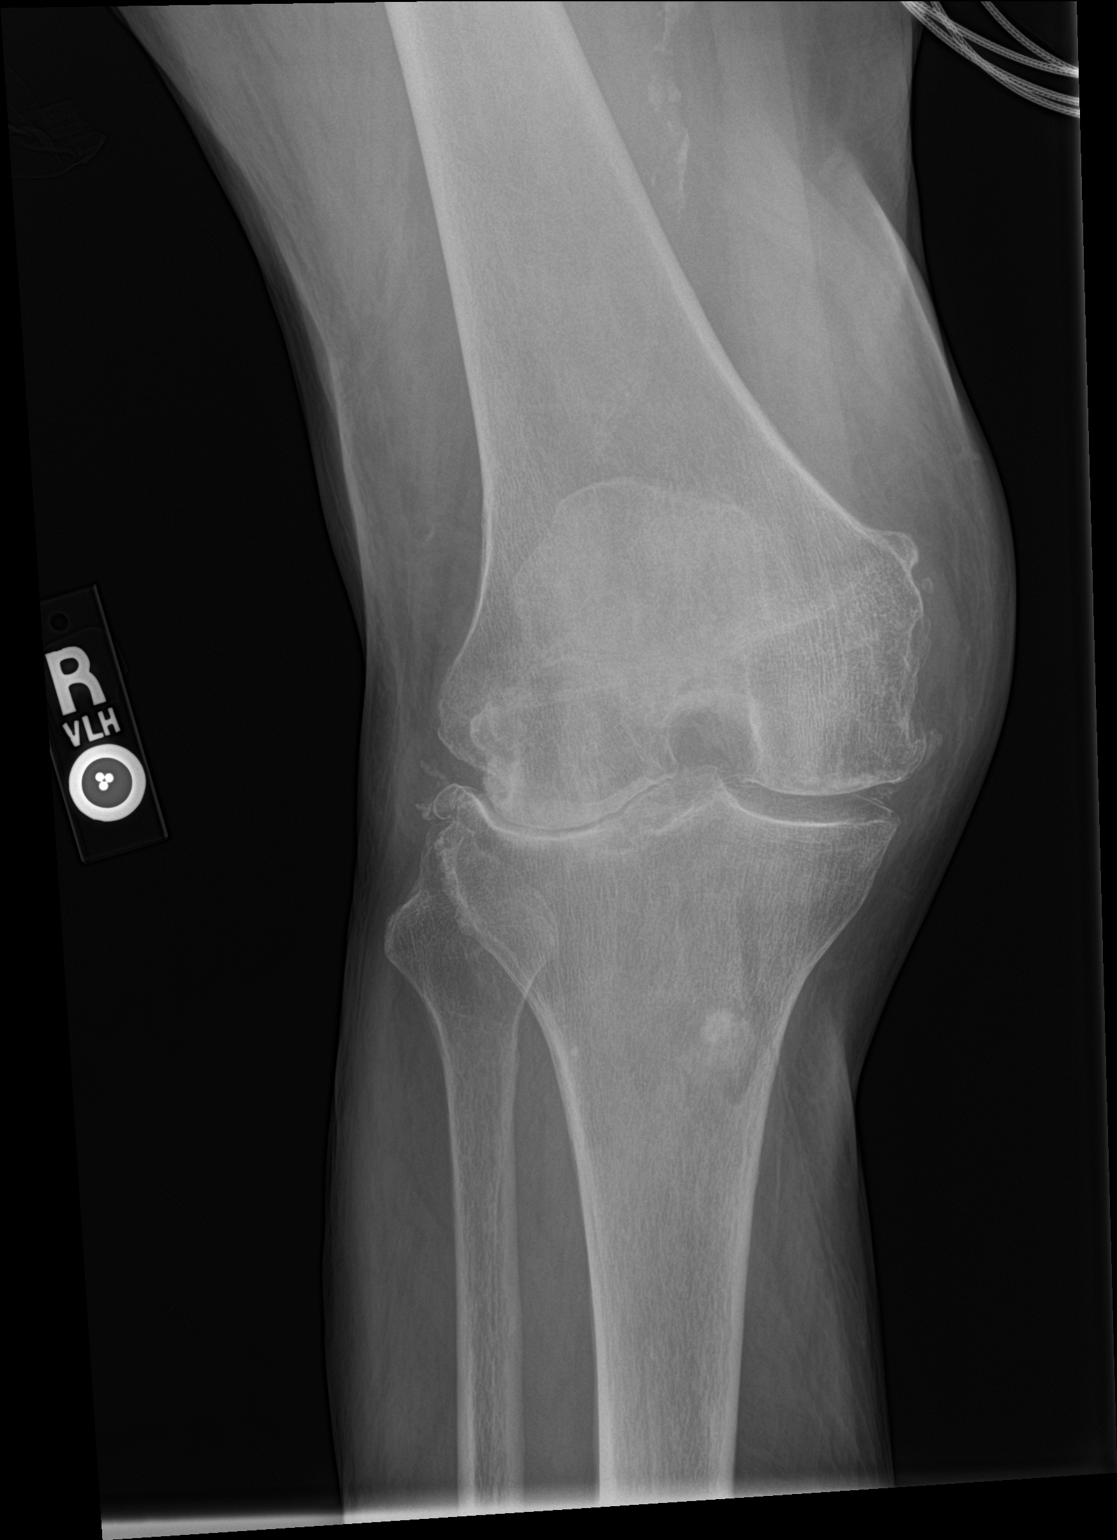
[im 2/2]
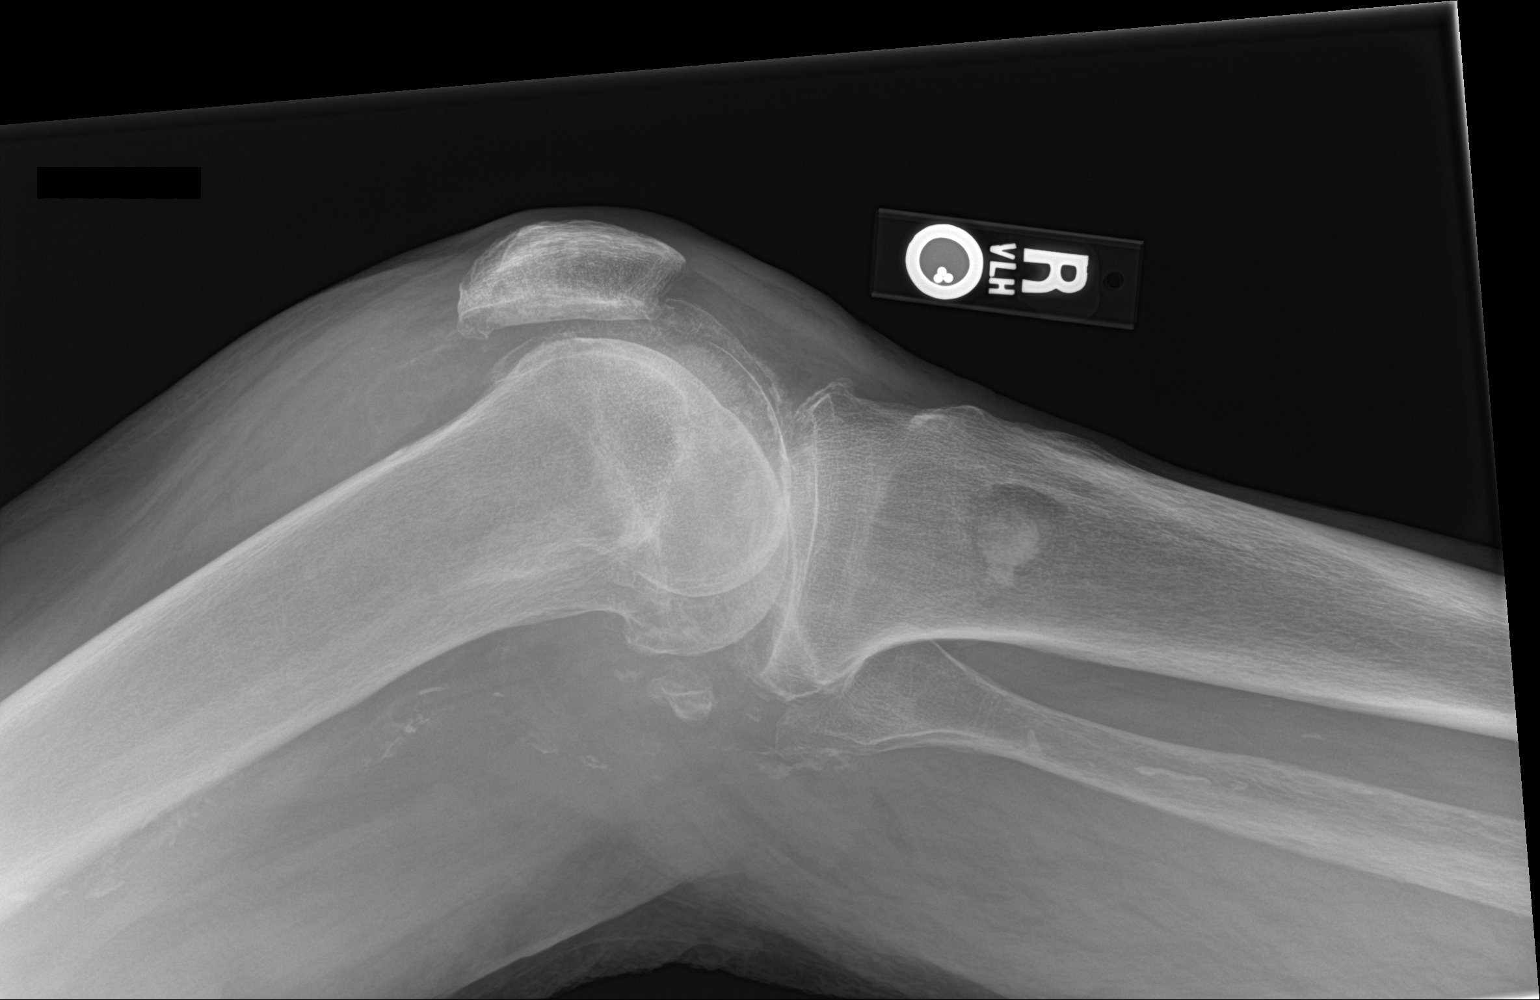

[2 of 2 positions shown; findings below may reference images not displayed]

FINDINGS: Advanced degenerative change in the RIGHT knee with complete loss of
the joint space in the lateral compartment. Patellofemoral
osteoarthritic changes as well as marked narrowing of the joint
space in the medial compartment. Lucency with central increased
density in the proximal RIGHT tibia measuring 2.5 x 1.9 cm. Slight
enlargement of joint effusion. No acute fracture or dislocation.
IMPRESSION: 1. Advanced degenerative change in the RIGHT knee.
2. Likely proximal tibial intraosseous lipoma. There is history of
malignancy MRI or CT for further assessment could be considered.
3. Slight enlargement of joint effusion.
